# Patient Record
Sex: Male | Born: 1965 | Race: White | Hispanic: No | Marital: Married | Smoking: Never smoker
Health system: Southern US, Community
[De-identification: ages and names within clinical notes are randomized; demographics above are authoritative.]

## PROBLEM LIST (undated history)

## (undated) DIAGNOSIS — K649 Unspecified hemorrhoids: Secondary | ICD-10-CM

## (undated) DIAGNOSIS — M5412 Radiculopathy, cervical region: Secondary | ICD-10-CM

## (undated) DIAGNOSIS — R079 Chest pain, unspecified: Secondary | ICD-10-CM

## (undated) DIAGNOSIS — G4733 Obstructive sleep apnea (adult) (pediatric): Secondary | ICD-10-CM

## (undated) DIAGNOSIS — F32A Depression, unspecified: Secondary | ICD-10-CM

## (undated) DIAGNOSIS — Z9989 Dependence on other enabling machines and devices: Secondary | ICD-10-CM

## (undated) DIAGNOSIS — F418 Other specified anxiety disorders: Secondary | ICD-10-CM

## (undated) DIAGNOSIS — R072 Precordial pain: Secondary | ICD-10-CM

## (undated) DIAGNOSIS — F329 Major depressive disorder, single episode, unspecified: Secondary | ICD-10-CM

## (undated) HISTORY — PX: VASECTOMY: SHX75

## (undated) HISTORY — DX: Unspecified hemorrhoids: K64.9

## (undated) HISTORY — DX: Obstructive sleep apnea (adult) (pediatric): G47.33

## (undated) HISTORY — DX: Precordial pain: R07.2

## (undated) HISTORY — DX: Radiculopathy, cervical region: M54.12

## (undated) HISTORY — DX: Chest pain, unspecified: R07.9

## (undated) HISTORY — DX: Other specified anxiety disorders: F41.8

## (undated) HISTORY — DX: Dependence on other enabling machines and devices: Z99.89

---

## 2013-07-26 ENCOUNTER — Other Ambulatory Visit: Payer: Self-pay | Admitting: Emergency Medicine

## 2013-07-26 ENCOUNTER — Ambulatory Visit
Admission: RE | Admit: 2013-07-26 | Discharge: 2013-07-26 | Disposition: A | Discharge status: Home / Self Care | Source: Ambulatory Visit | Attending: Emergency Medicine | Admitting: Emergency Medicine

## 2013-07-26 DIAGNOSIS — R42 Dizziness and giddiness: Secondary | ICD-10-CM

## 2013-07-26 DIAGNOSIS — R519 Headache, unspecified: Secondary | ICD-10-CM

## 2015-11-05 ENCOUNTER — Emergency Department (HOSPITAL_COMMUNITY)

## 2015-11-05 ENCOUNTER — Inpatient Hospital Stay (HOSPITAL_COMMUNITY)
Admission: EM | Admit: 2015-11-05 | Discharge: 2015-11-06 | DRG: 313 | Disposition: A | Discharge status: Home / Self Care | Attending: Internal Medicine | Admitting: Internal Medicine

## 2015-11-05 ENCOUNTER — Inpatient Hospital Stay (HOSPITAL_COMMUNITY)

## 2015-11-05 ENCOUNTER — Other Ambulatory Visit (HOSPITAL_COMMUNITY)

## 2015-11-05 ENCOUNTER — Encounter (HOSPITAL_COMMUNITY): Payer: Self-pay | Admitting: Emergency Medicine

## 2015-11-05 DIAGNOSIS — R072 Precordial pain: Secondary | ICD-10-CM | POA: Diagnosis present

## 2015-11-05 DIAGNOSIS — R071 Chest pain on breathing: Secondary | ICD-10-CM | POA: Diagnosis not present

## 2015-11-05 DIAGNOSIS — R091 Pleurisy: Secondary | ICD-10-CM | POA: Diagnosis present

## 2015-11-05 DIAGNOSIS — R079 Chest pain, unspecified: Secondary | ICD-10-CM | POA: Diagnosis present

## 2015-11-05 DIAGNOSIS — R0981 Nasal congestion: Secondary | ICD-10-CM | POA: Diagnosis present

## 2015-11-05 DIAGNOSIS — Z79899 Other long term (current) drug therapy: Secondary | ICD-10-CM | POA: Diagnosis not present

## 2015-11-05 DIAGNOSIS — M50222 Other cervical disc displacement at C5-C6 level: Secondary | ICD-10-CM | POA: Diagnosis present

## 2015-11-05 DIAGNOSIS — K210 Gastro-esophageal reflux disease with esophagitis: Secondary | ICD-10-CM | POA: Diagnosis present

## 2015-11-05 DIAGNOSIS — F329 Major depressive disorder, single episode, unspecified: Secondary | ICD-10-CM | POA: Diagnosis present

## 2015-11-05 DIAGNOSIS — R0789 Other chest pain: Secondary | ICD-10-CM

## 2015-11-05 DIAGNOSIS — F32A Depression, unspecified: Secondary | ICD-10-CM

## 2015-11-05 DIAGNOSIS — M5412 Radiculopathy, cervical region: Secondary | ICD-10-CM | POA: Diagnosis present

## 2015-11-05 HISTORY — DX: Major depressive disorder, single episode, unspecified: F32.9

## 2015-11-05 HISTORY — DX: Chest pain, unspecified: R07.9

## 2015-11-05 HISTORY — DX: Depression, unspecified: F32.A

## 2015-11-05 HISTORY — DX: Precordial pain: R07.2

## 2015-11-05 LAB — CBC
HCT: 43.2 % (ref 39.0–52.0)
Hemoglobin: 14.5 g/dL (ref 13.0–17.0)
MCH: 29 pg (ref 26.0–34.0)
MCHC: 33.6 g/dL (ref 30.0–36.0)
MCV: 86.4 fL (ref 78.0–100.0)
PLATELETS: 216 10*3/uL (ref 150–400)
RBC: 5 MIL/uL (ref 4.22–5.81)
RDW: 13.7 % (ref 11.5–15.5)
WBC: 6.9 10*3/uL (ref 4.0–10.5)

## 2015-11-05 LAB — BASIC METABOLIC PANEL
Anion gap: 9 (ref 5–15)
BUN: 19 mg/dL (ref 6–20)
CALCIUM: 8.9 mg/dL (ref 8.9–10.3)
CHLORIDE: 106 mmol/L (ref 101–111)
CO2: 24 mmol/L (ref 22–32)
CREATININE: 1.02 mg/dL (ref 0.61–1.24)
GFR calc Af Amer: >60 mL/min (ref >60)
GFR calc non Af Amer: >60 mL/min (ref >60)
Glucose, Bld: 117 mg/dL — ABNORMAL HIGH (ref 65–99)
Potassium: 4.5 mmol/L (ref 3.5–5.1)
Sodium: 139 mmol/L (ref 135–145)

## 2015-11-05 LAB — TROPONIN I
Troponin I: <0.03 ng/mL (ref <0.031)
Troponin I: <0.03 ng/mL (ref <0.031)

## 2015-11-05 LAB — D-DIMER, QUANTITATIVE: D-Dimer, Quant: <0.27 ug/mL-FEU (ref 0.00–0.50)

## 2015-11-05 LAB — I-STAT TROPONIN, ED: TROPONIN I, POC: 0 ng/mL (ref 0.00–0.08)

## 2015-11-05 MED ORDER — SIMETHICONE 80 MG PO CHEW
80.0000 mg | CHEWABLE_TABLET | Freq: Four times a day (QID) | ORAL | Status: DC | PRN
  Filled 2015-11-05: qty 1

## 2015-11-05 MED ORDER — ACETAMINOPHEN 325 MG PO TABS
650.0000 mg | ORAL_TABLET | ORAL | Status: DC | PRN
  Administered 2015-11-05: 650 mg via ORAL
  Filled 2015-11-05: qty 2

## 2015-11-05 MED ORDER — ASPIRIN 81 MG PO CHEW
324.0000 mg | CHEWABLE_TABLET | Freq: Once | ORAL | Status: AC
  Administered 2015-11-05: 324 mg via ORAL
  Filled 2015-11-05: qty 4

## 2015-11-05 MED ORDER — NITROGLYCERIN 0.4 MG SL SUBL
0.4000 mg | SUBLINGUAL_TABLET | SUBLINGUAL | Status: DC | PRN
  Administered 2015-11-05: 0.4 mg via SUBLINGUAL
  Filled 2015-11-05: qty 1

## 2015-11-05 MED ORDER — GADOBENATE DIMEGLUMINE 529 MG/ML IV SOLN
20.0000 mL | Freq: Once | INTRAVENOUS | Status: AC | PRN
  Administered 2015-11-05: 20 mL via INTRAVENOUS

## 2015-11-05 MED ORDER — ENOXAPARIN SODIUM 40 MG/0.4ML ~~LOC~~ SOLN
40.0000 mg | SUBCUTANEOUS | Status: DC
  Administered 2015-11-05 – 2015-11-06 (×2): 40 mg via SUBCUTANEOUS
  Filled 2015-11-05 (×2): qty 0.4

## 2015-11-05 MED ORDER — PANTOPRAZOLE SODIUM 40 MG PO TBEC
40.0000 mg | DELAYED_RELEASE_TABLET | Freq: Every day | ORAL | Status: DC
  Administered 2015-11-05 – 2015-11-06 (×2): 40 mg via ORAL
  Filled 2015-11-05 (×2): qty 1

## 2015-11-05 MED ORDER — ONDANSETRON HCL 4 MG/2ML IJ SOLN: 4.0000 mg | Freq: Four times a day (QID) | INTRAMUSCULAR | Status: DC | PRN

## 2015-11-05 MED ORDER — CLONAZEPAM 0.5 MG PO TABS: 0.5000 mg | ORAL_TABLET | Freq: Two times a day (BID) | ORAL | Status: DC | PRN

## 2015-11-05 MED ORDER — FLUOXETINE HCL 20 MG PO CAPS
20.0000 mg | ORAL_CAPSULE | Freq: Every day | ORAL | Status: DC | PRN
  Filled 2015-11-05: qty 1

## 2015-11-05 NOTE — H&P (Addendum)
[facility] History and Physical  Nagee Goates ZOX:[id number] DOB: [birth year] DOA: 11/05/2015  Referring physician: ER  PCP: No primary care provider on file.   Chief Complaint: Chest pain   HPI:  50 year old male with a history of depression, no other significant past medical history, no family history of coronary artery disease who presents to the ER with substernal chest pain radiating into the right side of his neck, patient also had left-sided chest pain this morning with radiation of pain into the left arm associated with numbness and tingling of the left arm. Symptoms started at 6 AM today, while the patient was still in bed. Patient has had intermittent chest pains on and off for the last 9 years, not particularly related to exertion, 8-9 years ago the patient was admitted to [facility] for chest pain and had a negative stress test. Patient also complains of acute sinus congestion that started 2 days ago. No fever no cough      Review of Systems: negative for the following  Constitutional: Denies fever, chills, diaphoresis, appetite change and fatigue.  HEENT: Denies photophobia, eye pain, redness, hearing loss, ear pain, congestion, sore throat, rhinorrhea, sneezing, mouth sores, trouble swallowing, neck pain, neck stiffness and tinnitus.  Respiratory: Denies SOB, DOE, cough, chest tightness, and wheezing.  Cardiovascular: Positive for chest pain, palpitations and leg swelling.  Gastrointestinal: Denies nausea, vomiting, abdominal pain, diarrhea, constipation, blood in stool and abdominal distention.  Genitourinary: Denies dysuria, urgency, frequency, hematuria, flank pain and difficulty urinating.  Musculoskeletal: Denies myalgias, back pain, joint swelling, arthralgias and gait problem.  Skin: Denies pallor, rash and wound.  Neurological: Denies dizziness, seizures, syncope, weakness, light-headedness, numbness and headaches.  Hematological: Denies adenopathy. Easy  bruising, personal or family bleeding history  Psychiatric/Behavioral: Denies suicidal ideation, mood changes, confusion, nervousness, sleep disturbance and agitation       Past Medical History  Diagnosis Date  . Depression      Past Surgical History  Procedure Laterality Date  . Vasectomy        Social History:  has no tobacco, alcohol, and drug history on file.  No Known Allergies      FAMILY HISTORY  When questioned  Directly-patient reports  No family history of HTN, CVA ,DIABETES, TB, Cancer CAD, Bleeding Disorders, Sickle Cell, diabetes, anemia, asthma,   Prior to Admission medications   Medication Sig Start Date End Date Taking? Authorizing Provider  clonazePAM (KLONOPIN) 0.5 MG tablet Take 0.25 mg by mouth 2 (two) times daily as needed for anxiety.  10/01/15  Yes Historical Provider, MD  FLUoxetine (PROZAC) 20 MG capsule Take 20 mg by mouth daily as needed (anxiety).  10/01/15  Yes Historical Provider, MD     Physical Exam: Filed Vitals:   11/05/15 0721 11/05/15 0900 11/05/15 1024  BP: 126/91 102/70 124/73  Pulse: 90 74 72  Temp: 98.2 F (36.8 C)  98.5 F (36.9 C)  TempSrc: Oral  Oral  Resp:  16 18  Height:   5\' 10"  (1.778 m)  Weight:   101.424 kg (223 lb 9.6 oz)  SpO2: 99% 97% 99%     Constitutional: Vital signs reviewed. Patient is a well-developed and well-nourished in no acute distress and cooperative with exam. Alert and oriented x3.  Head: Normocephalic and atraumatic  Ear: TM normal bilaterally  Mouth: no erythema or exudates, MMM  Eyes: PERRL, EOMI, conjunctivae normal, No scleral icterus.  Neck: Supple, Trachea midline normal ROM, No JVD, mass, thyromegaly, or carotid bruit present.  Cardiovascular: RRR, S1 normal, S2 normal, no MRG, pulses symmetric and intact bilaterally  Pulmonary/Chest: CTAB, no wheezes, rales, or rhonchi  Abdominal: Soft. Non-tender, non-distended, bowel sounds are normal, no masses, organomegaly, or guarding present.  GU:  no CVA tenderness Musculoskeletal: No joint deformities, erythema, or stiffness, ROM full and no nontender Ext: no edema and no cyanosis, pulses palpable bilaterally (DP and PT)  Hematology: no cervical, inginal, or axillary adenopathy.  Neurological: A&O x3, Strenght is normal and symmetric bilaterally, cranial nerve II-XII are grossly intact, no focal motor deficit, sensory intact to light touch bilaterally.  Skin: Warm, dry and intact. No rash, cyanosis, or clubbing.  Psychiatric: Normal mood and affect. speech and behavior is normal. Judgment and thought content normal. Cognition and memory are normal.      Data Review   Micro Results No results found for this or any previous visit (from the past 240 hour(s)).  Radiology Reports Dg Chest 2 View  11/05/2015  CLINICAL DATA:  Chest pain EXAM: CHEST  2 VIEW COMPARISON:  11/05/2006 FINDINGS: The heart size and mediastinal contours are within normal limits. Both lungs are clear. The visualized skeletal structures are unremarkable. IMPRESSION: No active cardiopulmonary disease. Electronically Signed   By: Marlan Palau M.D.   On: 11/05/2015 07:55     CBC  Recent Labs Lab 11/05/15 0734  WBC 6.9  HGB 14.5  HCT 43.2  PLT 216  MCV 86.4  MCH 29.0  MCHC 33.6  RDW 13.7    Chemistries   Recent Labs Lab 11/05/15 0734  NA 139  K 4.5  CL 106  CO2 24  GLUCOSE 117*  BUN 19  CREATININE 1.02  CALCIUM 8.9   ------------------------------------------------------------------------------------------------------------------ estimated creatinine clearance is 102.3 mL/min (by C-G formula based on Cr of 1.02). ------------------------------------------------------------------------------------------------------------------ No results for input(s): HGBA1C in the last 72 hours. ------------------------------------------------------------------------------------------------------------------ No results for input(s): CHOL, HDL, LDLCALC,  TRIG, CHOLHDL, LDLDIRECT in the last 72 hours. ------------------------------------------------------------------------------------------------------------------ No results for input(s): TSH, T4TOTAL, T3FREE, THYROIDAB in the last 72 hours.  Invalid input(s): FREET3 ------------------------------------------------------------------------------------------------------------------ No results for input(s): VITAMINB12, FOLATE, FERRITIN, TIBC, IRON, RETICCTPCT in the last 72 hours.  Coagulation profile No results for input(s): INR, PROTIME in the last 168 hours.   Recent Labs  11/05/15 0733  DDIMER <0.27    Cardiac Enzymes  Recent Labs Lab 11/05/15 0733  TROPONINI <0.03   ------------------------------------------------------------------------------------------------------------------ Invalid input(s): POCBNP   CBG: No results for input(s): GLUCAP in the last 168 hours.     EKG: Independently reviewed. Date/Time: Monday November 05 2015 07:17:59 EST Ventricular Rate: 87 PR Interval: 140 QRS Duration: 103 QT Interval: 362 QTC Calculation: 435 R Axis: 69 Text Interpretation: Sinus rhythm Abnormal R-wave progression, early  transition Minimal ST depression, anterolateral leads    Assessment/Plan Principal Problem:   Precordial chest pain Heart score of 3 or less, patient admitted to telemetry under chest pain order set Initial workup including troponin, d-dimer, EKG reassuring Chest x-ray negative Symptoms atypical, nonexertional, doubt unstable angina, differential diagnosis includes viral pleurisy, reflux esophagitis, cervical radiculopathy Will start patient on a PPI MRI of the C-spine today, patient is agreeable 2-D echo, If workup negative anticipate discharge tomorrow    Depression-continue outpatient medications        Code Status Orders    Full code    Start     Ordered     Family Communication: bedside Disposition Plan: admit   Total  time spent 55 minutes.Greater than 50% of this time was  spent in counseling, explanation of diagnosis, planning of further management, and coordination of care  [facility] Pager [phone]  If 7PM-7AM, please contact night-coverage [email or web address] Password [email or web address] 11/05/2015, 11:05 AM    The

## 2015-11-05 NOTE — ED Notes (Signed)
Pt states woke up with left chest, bilateral shoulders, upper back, and left arm pain. Denies SOB, N/V, weakness, dizziness. Hx of right sided problems similar to this with negative stress test performed in past

## 2015-11-05 NOTE — ED Provider Notes (Signed)
CSN: [id number]     Arrival date & time 11/05/15  0705 History   First MD Initiated Contact with Patient 11/05/15 0725     Chief Complaint  Patient presents with  . Chest Pain  . Arm Pain     (Consider location/radiation/quality/duration/timing/severity/associated sxs/prior Treatment) HPI Comments: Patient here complaining of sudden onset of substernal chest pain that began while he was walking at home. Pain started in his chest and went up into his neck and down his left arm with some associated left arm paresthesias history of similar symptoms but usually with radiation to the right side. According to him, he had a negative stress test 8 or 9 years ago. He denies any associated dyspnea, diaphoresis. Pain is worse with exertion better with rest. No treatment use prior to arrival. Denies any recent history of leg pain or swelling. No URI symptoms. Was in his baseline state of health yesterday  Patient is a 50 y.o. male presenting with chest pain and arm pain. The history is provided by the patient and the spouse.  Chest Pain Arm Pain Associated symptoms include chest pain.    Past Medical History  Diagnosis Date  . Depression    Past Surgical History  Procedure Laterality Date  . Vasectomy     History reviewed. No pertinent family history. Social History  Substance Use Topics  . Smoking status: None  . Smokeless tobacco: None  . Alcohol Use: None    Review of Systems  Cardiovascular: Positive for chest pain.  All other systems reviewed and are negative.     Allergies  Review of patient's allergies indicates no known allergies.  Home Medications   Prior to Admission medications   Not on File   BP 126/91 mmHg  Pulse 90  Temp(Src) 98.2 F (36.8 C) (Oral)  SpO2 99% Physical Exam  Constitutional: He is oriented to person, place, and time. He appears well-developed and well-nourished.  Non-toxic appearance. No distress.  HENT:  Head: Normocephalic and atraumatic.   Eyes: Conjunctivae, EOM and lids are normal. Pupils are equal, round, and reactive to light.  Neck: Normal range of motion. Neck supple. No tracheal deviation present. No thyroid mass present.  Cardiovascular: Normal rate, regular rhythm and normal heart sounds.  Exam reveals no gallop.   No murmur heard. Pulmonary/Chest: Effort normal and breath sounds normal. No stridor. No respiratory distress. He has no decreased breath sounds. He has no wheezes. He has no rhonchi. He has no rales.  Abdominal: Soft. Normal appearance and bowel sounds are normal. He exhibits no distension. There is no tenderness. There is no rebound and no CVA tenderness.  Musculoskeletal: Normal range of motion. He exhibits no edema or tenderness.  Neurological: He is alert and oriented to person, place, and time. He has normal strength. No cranial nerve deficit or sensory deficit. GCS eye subscore is 4. GCS verbal subscore is 5. GCS motor subscore is 6.  Skin: Skin is warm and dry. No abrasion and no rash noted.  Psychiatric: He has a normal mood and affect. His speech is normal and behavior is normal.  Nursing note and vitals reviewed.   ED Course  Procedures (including critical care time) Labs Review Labs Reviewed  BASIC METABOLIC PANEL  CBC  I-STAT TROPOININ, ED    Imaging Review No results found. I have personally reviewed and evaluated these images and lab results as part of my medical decision-making.   EKG Interpretation   Date/Time:  Monday November 05 2015 07:17:59  EST Ventricular Rate:  87 PR Interval:  140 QRS Duration: 103 QT Interval:  362 QTC Calculation: 435 R Axis:   69 Text Interpretation:  Sinus rhythm Abnormal R-wave progression, early  transition Minimal ST depression, anterolateral leads Confirmed by Deverick Pruss   MD, Bevely Hackbart ([id number]) on 11/05/2015 7:26:29 AM      MDM   Final diagnoses:  None    Patient given aspirin and also nitroglycerin and pain is greatly improved. Will be  admitted for chest pain workup. No acute EKG changes and troponin is negative.    Lorre Nick, MD 11/05/15 [phone]

## 2015-11-05 NOTE — Progress Notes (Signed)
MD notified of return of patient's chest pain. Patient stated, "The pain is back like when I came to the hospital. I don't know if it's because I ate or not." Pain located in mid/left chest- rated a 5. VSS. New orders received- obtain EKG and administer PO Protonix. Patient stated pain was "getting better" while obtaining EKG.

## 2015-11-05 NOTE — ED Notes (Signed)
MD at bedside. 

## 2015-11-06 ENCOUNTER — Inpatient Hospital Stay (HOSPITAL_COMMUNITY)

## 2015-11-06 DIAGNOSIS — R071 Chest pain on breathing: Secondary | ICD-10-CM

## 2015-11-06 DIAGNOSIS — R079 Chest pain, unspecified: Secondary | ICD-10-CM

## 2015-11-06 DIAGNOSIS — M5412 Radiculopathy, cervical region: Secondary | ICD-10-CM

## 2015-11-06 LAB — CBC
HEMATOCRIT: 42.1 % (ref 39.0–52.0)
HEMOGLOBIN: 14 g/dL (ref 13.0–17.0)
MCH: 29.2 pg (ref 26.0–34.0)
MCHC: 33.3 g/dL (ref 30.0–36.0)
MCV: 87.9 fL (ref 78.0–100.0)
Platelets: 176 10*3/uL (ref 150–400)
RBC: 4.79 MIL/uL (ref 4.22–5.81)
RDW: 13.9 % (ref 11.5–15.5)
WBC: 5.7 10*3/uL (ref 4.0–10.5)

## 2015-11-06 LAB — COMPREHENSIVE METABOLIC PANEL
ALBUMIN: 4 g/dL (ref 3.5–5.0)
ALK PHOS: 63 U/L (ref 38–126)
ALT: 27 U/L (ref 17–63)
AST: 17 U/L (ref 15–41)
Anion gap: 6 (ref 5–15)
BILIRUBIN TOTAL: 0.6 mg/dL (ref 0.3–1.2)
BUN: 19 mg/dL (ref 6–20)
CALCIUM: 8.9 mg/dL (ref 8.9–10.3)
CO2: 28 mmol/L (ref 22–32)
CREATININE: 0.94 mg/dL (ref 0.61–1.24)
Chloride: 107 mmol/L (ref 101–111)
GFR calc Af Amer: >60 mL/min (ref >60)
GFR calc non Af Amer: >60 mL/min (ref >60)
GLUCOSE: 114 mg/dL — AB (ref 65–99)
Potassium: 4.2 mmol/L (ref 3.5–5.1)
Sodium: 141 mmol/L (ref 135–145)
TOTAL PROTEIN: 6.8 g/dL (ref 6.5–8.1)

## 2015-11-06 MED ORDER — PANTOPRAZOLE SODIUM 40 MG PO TBEC: 40.0000 mg | DELAYED_RELEASE_TABLET | Freq: Every day | ORAL | Status: DC

## 2015-11-06 NOTE — Care Management Note (Signed)
Case Management Note  Patient Details  Name: Gabriel Vasquez MRN: [id number] Date of Birth: 1966-05-09  Subjective/Objective:  50 y/o m admitted w/Chest Pain. From home. Received referral for pcp. Patient has [employer] benefit. Patient has chosen to go to [facility]-CM called [facility] office-they have an assigned pcp & patient will have to call their office to establish the pcp appt. Patient provided w/brochure(tel#, address). Patient voiced understanding.Patient has a pharmacy.Informed patient of observation status.Patient voiced understanding.                  Action/Plan:d/c home.   Expected Discharge Date:   (UNKNOWN)               Expected Discharge Plan:  Home/Self Care  In-House Referral:     Discharge planning Services  CM Consult, Follow-up appt scheduled  Post Acute Care Choice:    Choice offered to:     DME Arranged:    DME Agency:     HH Arranged:    HH Agency:     Status of Service:  Completed, signed off  Medicare Important Message Given:    Date Medicare IM Given:    Medicare IM give by:    Date Additional Medicare IM Given:    Additional Medicare Important Message give by:     If discussed at Long Length of Stay Meetings, dates discussed:    Additional Comments:  Lanier Clam, RN 11/06/2015, 12:24 PM

## 2015-11-06 NOTE — Progress Notes (Signed)
Echocardiogram 2D Echocardiogram has been performed.  Gabriel Vasquez 11/06/2015, 10:59 AM

## 2015-11-06 NOTE — Discharge Summary (Addendum)
Physician Discharge Summary  Gabriel Vasquez MRN: [id number] DOB/AGE: 1966/09/06 50 y.o.  PCP: No primary care provider on file.   Admit date: 11/05/2015 Discharge date: 11/06/2015  Discharge Diagnoses:   Principal Problem:   Precordial chest pain Active Problems:   Depression   Chest pain    Follow-up recommendations Follow-up with PCP in 3-5 days , including all  additional recommended appointments as below Follow-up CBC, CMP in 3-5 days PCP to review results of 2-D echo     Medication List    TAKE these medications        clonazePAM 0.5 MG tablet  Commonly known as:  KLONOPIN  Take 0.25 mg by mouth 2 (two) times daily as needed for anxiety.     FLUoxetine 20 MG capsule  Commonly known as:  PROZAC  Take 20 mg by mouth daily as needed (anxiety).     pantoprazole 40 MG tablet  Commonly known as:  PROTONIX  Take 1 tablet (40 mg total) by mouth daily.         Discharge Condition: Stable  Discharge Instructions       Discharge Instructions    Diet - low sodium heart healthy    Complete by:  As directed      Increase activity slowly    Complete by:  As directed            No Known Allergies    Disposition: Final discharge disposition not confirmed   Consults: * None  Significant Diagnostic Studies:  Dg Chest 2 View  11/05/2015  CLINICAL DATA:  Chest pain EXAM: CHEST  2 VIEW COMPARISON:  11/05/2006 FINDINGS: The heart size and mediastinal contours are within normal limits. Both lungs are clear. The visualized skeletal structures are unremarkable. IMPRESSION: No active cardiopulmonary disease. Electronically Signed   By: Franchot Gallo M.D.   On: 11/05/2015 07:55   Mr Cervical Spine W Wo Contrast  11/05/2015  CLINICAL DATA:  50 year old male with C5 radiculopathy. Neck pain and left arm pain. No recall of injury. No history of cancer. Initial encounter. EXAM: MRI CERVICAL SPINE WITHOUT AND WITH CONTRAST TECHNIQUE: Multiplanar and multiecho pulse  sequences of the cervical spine, to include the craniocervical junction and cervicothoracic junction, were obtained according to standard protocol without and with intravenous contrast. CONTRAST:  58m MULTIHANCE GADOBENATE DIMEGLUMINE 529 MG/ML IV SOLN COMPARISON:  None. FINDINGS: Patient moved during exam. Cervical medullary junction unremarkable. Maxillary sinus mucosal thickening. No focal cord signal abnormality or enhancement. C2-3: Minimal right uncinate hypertrophy with minimal right foraminal narrowing. C3-4:  Minimal bulge.  Minimal narrowing ventral thecal sac. C4-5: Mild bulge. Mild narrowing ventral thecal sac. Minimal foraminal narrowing greater on the right. C5-6: Shallow left paracentral protrusion. Narrowing left ventral thecal sac. Minimal foraminal narrowing greater on the left. C6-7:  Negative. C7-T1:  Negative. IMPRESSION: Patient moved during exam. C5-6 shallow left paracentral protrusion. Narrowing left ventral thecal sac. Minimal foraminal narrowing greater on the left. C4-5 mild bulge. Mild narrowing ventral thecal sac. Minimal foraminal narrowing greater on the right. C3-4 minimal bulge with minimal narrowing ventral thecal sac. C2-3 minimal right foraminal narrowing. Electronically Signed   By: SGenia DelM.D.   On: 11/05/2015 14:31      2-D echo pending   Filed Weights   11/05/15 1024  Weight: 101.424 kg (223 lb 9.6 oz)     Microbiology: No results found for this or any previous visit (from the past 240 hour(s)).     Blood Culture No  results found for: SDES, [place], [place], REPTSTATUS    Labs: Results for orders placed or performed during the hospital encounter of 11/05/15 (from the past 48 hour(s))  D-dimer, quantitative (not at [facility])     Status: None   Collection Time: 11/05/15  7:33 AM  Result Value Ref Range   D-Dimer, Quant <0.27 0.00 - 0.50 ug/mL-FEU    Comment: (NOTE) At the manufacturer cut-off of 0.50 ug/mL FEU, this assay has been documented  to exclude PE with a sensitivity and negative predictive value of 97 to 99%.  At this time, this assay has not been approved by the FDA to exclude DVT/VTE. Results should be correlated with clinical presentation.   Troponin I-serum (0, 3, 6 hours)     Status: None   Collection Time: 11/05/15  7:33 AM  Result Value Ref Range   Troponin I <0.03 <0.031 ng/mL    Comment:        NO INDICATION OF MYOCARDIAL INJURY.   Basic metabolic panel     Status: Abnormal   Collection Time: 11/05/15  7:34 AM  Result Value Ref Range   Sodium 139 135 - 145 mmol/L   Potassium 4.5 3.5 - 5.1 mmol/L   Chloride 106 101 - 111 mmol/L   CO2 24 22 - 32 mmol/L   Glucose, Bld 117 (H) 65 - 99 mg/dL   BUN 19 6 - 20 mg/dL   Creatinine, Ser 1.02 0.61 - 1.24 mg/dL   Calcium 8.9 8.9 - 10.3 mg/dL   GFR calc non Af Amer >60 >60 mL/min   GFR calc Af Amer >60 >60 mL/min    Comment: (NOTE) The eGFR has been calculated using the CKD EPI equation. This calculation has not been validated in all clinical situations. eGFR's persistently <60 mL/min signify possible Chronic Kidney Disease.    Anion gap 9 5 - 15  CBC     Status: None   Collection Time: 11/05/15  7:34 AM  Result Value Ref Range   WBC 6.9 4.0 - 10.5 K/uL   RBC 5.00 4.22 - 5.81 MIL/uL   Hemoglobin 14.5 13.0 - 17.0 g/dL   HCT 43.2 39.0 - 52.0 %   MCV 86.4 78.0 - 100.0 fL   MCH 29.0 26.0 - 34.0 pg   MCHC 33.6 30.0 - 36.0 g/dL   RDW 13.7 11.5 - 15.5 %   Platelets 216 150 - 400 K/uL  I-stat troponin, ED (not at [facility])     Status: None   Collection Time: 11/05/15  7:49 AM  Result Value Ref Range   Troponin i, poc 0.00 0.00 - 0.08 ng/mL   Comment 3            Comment: Due to the release kinetics of cTnI, a negative result within the first hours of the onset of symptoms does not rule out myocardial infarction with certainty. If myocardial infarction is still suspected, repeat the test at appropriate intervals.   Troponin I-serum (0, 3, 6 hours)      Status: None   Collection Time: 11/05/15 11:55 AM  Result Value Ref Range   Troponin I <0.03 <0.031 ng/mL    Comment:        NO INDICATION OF MYOCARDIAL INJURY.   Troponin I-serum (0, 3, 6 hours)     Status: None   Collection Time: 11/05/15  5:52 PM  Result Value Ref Range   Troponin I <0.03 <0.031 ng/mL    Comment:  NO INDICATION OF MYOCARDIAL INJURY.   CBC     Status: None   Collection Time: 11/06/15  5:55 AM  Result Value Ref Range   WBC 5.7 4.0 - 10.5 K/uL   RBC 4.79 4.22 - 5.81 MIL/uL   Hemoglobin 14.0 13.0 - 17.0 g/dL   HCT 42.1 39.0 - 52.0 %   MCV 87.9 78.0 - 100.0 fL   MCH 29.2 26.0 - 34.0 pg   MCHC 33.3 30.0 - 36.0 g/dL   RDW 13.9 11.5 - 15.5 %   Platelets 176 150 - 400 K/uL  Comprehensive metabolic panel     Status: Abnormal   Collection Time: 11/06/15  5:55 AM  Result Value Ref Range   Sodium 141 135 - 145 mmol/L   Potassium 4.2 3.5 - 5.1 mmol/L   Chloride 107 101 - 111 mmol/L   CO2 28 22 - 32 mmol/L   Glucose, Bld 114 (H) 65 - 99 mg/dL   BUN 19 6 - 20 mg/dL   Creatinine, Ser 0.94 0.61 - 1.24 mg/dL   Calcium 8.9 8.9 - 10.3 mg/dL   Total Protein 6.8 6.5 - 8.1 g/dL   Albumin 4.0 3.5 - 5.0 g/dL   AST 17 15 - 41 U/L   ALT 27 17 - 63 U/L   Alkaline Phosphatase 63 38 - 126 U/L   Total Bilirubin 0.6 0.3 - 1.2 mg/dL   GFR calc non Af Amer >60 >60 mL/min   GFR calc Af Amer >60 >60 mL/min    Comment: (NOTE) The eGFR has been calculated using the CKD EPI equation. This calculation has not been validated in all clinical situations. eGFR's persistently <60 mL/min signify possible Chronic Kidney Disease.    Anion gap 6 5 - 15     Lipid Panel  No results found for: CHOL, TRIG, HDL, CHOLHDL, VLDL, LDLCALC, LDLDIRECT   No results found for: HGBA1C   Lab Results  Component Value Date   CREATININE 0.94 11/06/2015     HPI :HPI:  50 year old male with a history of depression, no other significant past medical history, no family history of coronary  artery disease who presents to the ER with substernal chest pain radiating into the right side of his neck, patient also had left-sided chest pain this morning with radiation of pain into the left arm associated with numbness and tingling of the left arm. Symptoms started at 6 AM today, while the patient was still in bed. Patient has had intermittent chest pains on and off for the last 9 years, not particularly related to exertion, 8-9 years ago the patient was admitted to [facility] for chest pain and had a negative stress test. Patient also complains of acute sinus congestion that started 2 days ago. No fever no cough  HOSPITAL COURSE:   Precordial chest pain Heart score of 3 or less, patient admitted to telemetry which showed normal sinus rhythm Initial workup including troponin 3 negative, d-dimer negative, EKG reassuring Chest x-ray negative Symptoms atypical, nonexertional, doubt unstable angina, differential diagnosis includes viral pleurisy, reflux esophagitis, cervical radiculopathy Will start patient on a PPI which will be continued MRI of the C-spine as above, C5-C6 shallow left paracentral protrusion with narrowing of the left ventral thecal sac could explain patient's left upper extremity numbness and tingling 2-D echo pending, patient may be discharged home after completion of the test request PCP to follow-up on the results of the 2-D echo Patient is currently hemodynamically stable   Depression-continue outpatient medications  Discharge Exam:   Blood pressure 118/78, pulse 67, temperature 98.1 F (36.7 C), temperature source Oral, resp. rate 18, height 5' 10"  (1.778 m), weight 101.424 kg (223 lb 9.6 oz), SpO2 100 %. Cardiovascular: RRR, S1 normal, S2 normal, no MRG, pulses symmetric and intact bilaterally  Pulmonary/Chest: CTAB, no wheezes, rales, or rhonchi  Abdominal: Soft. Non-tender, non-distended, bowel sounds are normal, no masses, organomegaly, or guarding  present.  GU: no CVA tenderness Musculoskeletal: No joint deformities, erythema, or stiffness, ROM full and no nontender Ext: no edema and no cyanosis, pulses palpable bilaterally (DP and PT)  Hematology: no cervical, inginal, or axillary adenopathy.  Neurological: A&O x3, Strenght is normal and symmetric bilaterally, cranial nerve II-XII are grossly intact, no focal motor deficit, sensory intact to light touch bilaterally.      Follow-up Information    Follow up with PCP. Schedule an appointment as soon as possible for a visit in 3 days.      SignedReyne Dumas 11/06/2015, 10:24 AM        Time spent >45 mins

## 2015-12-05 ENCOUNTER — Ambulatory Visit (INDEPENDENT_AMBULATORY_CARE_PROVIDER_SITE_OTHER): Admitting: Cardiology

## 2015-12-05 ENCOUNTER — Telehealth (HOSPITAL_COMMUNITY): Payer: Self-pay | Admitting: *Deleted

## 2015-12-05 ENCOUNTER — Encounter: Payer: Self-pay | Admitting: Cardiology

## 2015-12-05 VITALS — BP 124/74 | HR 78 | Ht 70.0 in | Wt 221.6 lb

## 2015-12-05 DIAGNOSIS — M5412 Radiculopathy, cervical region: Secondary | ICD-10-CM

## 2015-12-05 DIAGNOSIS — R0789 Other chest pain: Secondary | ICD-10-CM | POA: Diagnosis not present

## 2015-12-05 LAB — LIPID PANEL
Cholesterol: 194 mg/dL (ref 125–200)
HDL: 41 mg/dL (ref >=40)
LDL Cholesterol: 135 mg/dL — ABNORMAL HIGH (ref <130)
Total CHOL/HDL Ratio: 4.7 Ratio (ref <=5.0)
Triglycerides: 91 mg/dL (ref <150)
VLDL: 18 mg/dL (ref <30)

## 2015-12-05 NOTE — Telephone Encounter (Signed)
Patient given detailed instructions per Myocardial Perfusion Study Information Sheet for the test on 12/10/15. Patient notified to arrive 15 minutes early and that it is imperative to arrive on time for appointment to keep from having the test rescheduled.  If you need to cancel or reschedule your appointment, please call the office within 24 hours of your appointment. Failure to do so may result in a cancellation of your appointment, and a $50 no show fee. Patient verbalized understanding.Kyran Whittier J Aline Wesche, RN  

## 2015-12-05 NOTE — Patient Instructions (Signed)
Medication Instructions:   Your physician recommends that you continue on your current medications as directed. Please refer to the Current Medication list given to you today.  Labwork: TODAY   LIPID  Testing/Procedure Your physician has requested that you have en exercise stress myoview. For further information please visit [email or web address]. Please follow instruction sheet, as given.   Follow-Up: Your physician recommends that you schedule a follow-up appointment in:  AS NEEDED  Any Other Special Instructions Will Be Listed Below (If Applicable).     If you need a refill on your cardiac medications before your next appointment, please call your pharmacy.

## 2015-12-05 NOTE — Progress Notes (Signed)
Cardiology Office Note    Date:  12/05/2015   ID:  Quadarius Henton, DOB 12-27-1965, MRN [id number]  PCP:  No primary care provider on file.  Cardiologist:   Donato Schultz, MD     History of Present Illness:  Gabriel Vasquez is a 50 y.o. male with history of depression no early family history of CAD who was in the hospital on 11/06/15 with chest pain substernal radiating to his right side, also left side radiating at times the left arm with some numbness and tingling as well. He had intermittent chest pain over the last 9 years. He had a negative stress test about 10 years ago.  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 PPI helps with GERD. Cervical spine disease noted  He works in [place], creates highway signs, maintains about 13,000 steps a day. Quite active.  Past Medical History  Diagnosis Date  . Depression   . Precordial chest pain 11/05/2015  . Chest pain 11/05/2015  . Cervical radiculopathy at C5     Past Surgical History  Procedure Laterality Date  . Vasectomy      Outpatient Prescriptions Prior to Visit  Medication Sig Dispense Refill  . clonazePAM (KLONOPIN) 0.5 MG tablet Take 0.25 mg by mouth 2 (two) times daily as needed for anxiety.   0  . FLUoxetine (PROZAC) 20 MG capsule Take 20 mg by mouth daily as needed (anxiety).   1  . pantoprazole (PROTONIX) 40 MG tablet Take 1 tablet (40 mg total) by mouth daily. 30 tablet 2   No facility-administered medications prior to visit.     Allergies:   Review of patient's allergies indicates no known allergies.   Social History   Social History  . Marital Status: Married    Spouse Name: N/A  . Number of Children: N/A  . Years of Education: N/A   Social History Main Topics  . Smoking status: Never Smoker   . Smokeless tobacco: Never Used  . Alcohol Use: None  . Drug Use: No  . Sexual Activity: Yes   Other Topics Concern  . None   Social History Narrative     Family History:  The patient's family history includes Hypertension in  his mother.  Gfather 77 MI ROS:   Please see the history of present illness.    ROS All other systems reviewed and are negative.   PHYSICAL EXAM:   VS:  BP 124/74 mmHg  Pulse 78  Ht  (1.778 m)  Wt 221 lb 9.6 oz (100.517 kg)  BMI 31.80 kg/m2   GEN: Well nourished, well developed, in no acute distress HEENT: normal Neck: no JVD, carotid bruits, or masses Cardiac: RRR; no murmurs, rubs, or gallops,no edema  Respiratory:  clear to auscultation bilaterally, normal work of breathing GI: soft, nontender, nondistended, + BS MS: no deformity or atrophy Skin: warm and dry, no rash Neuro:  Alert and Oriented x 3, Strength and sensation are intact Psych: euthymic mood, full affect  Wt Readings from Last 3 Encounters:  12/05/15 221 lb 9.6 oz (100.517 kg)  11/05/15 223 lb 9.6 oz (101.424 kg)      Studies/Labs Reviewed:   EKG:  11/05/15 shows sinus rhythm with nonspecific ST-T wave changes.  Recent Labs: 11/06/2015: ALT 27; BUN 19; Creatinine, Ser 0.94; Hemoglobin 14.0; Platelets 176; Potassium 4.2; Sodium 141   Lipid Panel No results found for: CHOL, TRIG, HDL, CHOLHDL, VLDL, LDLCALC, LDLDIRECT  Additional studies/ records that were reviewed today include:  Hospital records reviewed,  echocardiogram reviewed    ASSESSMENT:    1. Other chest pain   2. Cervical radiculopathy at C5      PLAN:  In order of problems listed above:  Chest pain-we will go ahead and order a nuclear stress test to ensure it indeed doesn't have any evidence of ischemia. His echocardiogram does show hypokinesis of the inferior wall. Ejection fraction low normal. 50-55%. He had nonspecific ST-T wave changes on EKG.  We will check lipid panel. He does not know his cholesterol currently.  Cervical disc disease-certainly some of his symptoms, numbness at times could be radiculopathy. We discussed.  He was anxious about what he read on the Internet about his echocardiogram. Diastolic  dysfunction.    Medication Adjustments/Labs and Tests Ordered: Current medicines are reviewed at length with the patient today.  Concerns regarding medicines are outlined above.  Medication changes, Labs and Tests ordered today are listed in the Patient Instructions below. Patient Instructions  Medication Instructions:   Your physician recommends that you continue on your current medications as directed. Please refer to the Current Medication list given to you today.  Labwork: TODAY   LIPID  Testing/Procedure Your physician has requested that you have en exercise stress myoview. For further information please visit [email or web address]. Please follow instruction sheet, as given.   Follow-Up: Your physician recommends that you schedule a follow-up appointment in:  AS NEEDED  Any Other Special Instructions Will Be Listed Below (If Applicable).     If you need a refill on your cardiac medications before your next appointment, please call your pharmacy.         Mathews RobinsonsSigned, Anas Reister, Thad, MD  12/05/2015 11:05 AM    [facility] [address], [place], [place]  [address] Phone: [phone]; Fax: [phone]

## 2015-12-10 ENCOUNTER — Ambulatory Visit (HOSPITAL_COMMUNITY): Attending: Cardiovascular Disease

## 2015-12-10 DIAGNOSIS — R42 Dizziness and giddiness: Secondary | ICD-10-CM | POA: Insufficient documentation

## 2015-12-10 DIAGNOSIS — R002 Palpitations: Secondary | ICD-10-CM | POA: Insufficient documentation

## 2015-12-10 DIAGNOSIS — R0609 Other forms of dyspnea: Secondary | ICD-10-CM | POA: Diagnosis not present

## 2015-12-10 DIAGNOSIS — R0789 Other chest pain: Secondary | ICD-10-CM

## 2015-12-10 DIAGNOSIS — R9439 Abnormal result of other cardiovascular function study: Secondary | ICD-10-CM | POA: Insufficient documentation

## 2015-12-10 LAB — MYOCARDIAL PERFUSION IMAGING
CHL CUP NUCLEAR SDS: 0
CHL CUP NUCLEAR SSS: 0
CSEPHR: 91 %
CSEPPHR: 155 {beats}/min
Estimated workload: 11.6 METS
Exercise duration (min): 10 min
Exercise duration (sec): 0 s
LHR: 0.21
LV dias vol: 133 mL (ref 62–150)
LVSYSVOL: 73 mL
MPHR: 169 {beats}/min
Rest HR: 64 {beats}/min
SRS: 0
TID: 1.13

## 2015-12-10 MED ORDER — TECHNETIUM TC 99M SESTAMIBI GENERIC - CARDIOLITE
10.6000 | Freq: Once | INTRAVENOUS | Status: AC | PRN
  Administered 2015-12-10: 11 via INTRAVENOUS

## 2015-12-10 MED ORDER — TECHNETIUM TC 99M SESTAMIBI GENERIC - CARDIOLITE
31.5000 | Freq: Once | INTRAVENOUS | Status: AC | PRN
  Administered 2015-12-10: 32 via INTRAVENOUS

## 2015-12-20 ENCOUNTER — Ambulatory Visit (INDEPENDENT_AMBULATORY_CARE_PROVIDER_SITE_OTHER): Admitting: Cardiology

## 2015-12-20 ENCOUNTER — Encounter: Payer: Self-pay | Admitting: Cardiology

## 2015-12-20 ENCOUNTER — Encounter: Payer: Self-pay | Admitting: *Deleted

## 2015-12-20 VITALS — BP 114/76 | HR 66 | Ht 70.0 in | Wt 224.2 lb

## 2015-12-20 DIAGNOSIS — R0789 Other chest pain: Secondary | ICD-10-CM

## 2015-12-20 DIAGNOSIS — M5412 Radiculopathy, cervical region: Secondary | ICD-10-CM

## 2015-12-20 DIAGNOSIS — Z01818 Encounter for other preprocedural examination: Secondary | ICD-10-CM | POA: Diagnosis not present

## 2015-12-20 LAB — CBC WITH DIFFERENTIAL/PLATELET
BASOS ABS: 0 10*3/uL (ref 0.0–0.1)
BASOS PCT: 0 % (ref 0–1)
Eosinophils Absolute: 0.1 10*3/uL (ref 0.0–0.7)
Eosinophils Relative: 1 % (ref 0–5)
HEMATOCRIT: 42.3 % (ref 39.0–52.0)
HEMOGLOBIN: 14.6 g/dL (ref 13.0–17.0)
LYMPHS PCT: 27 % (ref 12–46)
Lymphs Abs: 1.8 10*3/uL (ref 0.7–4.0)
MCH: 29.2 pg (ref 26.0–34.0)
MCHC: 34.5 g/dL (ref 30.0–36.0)
MCV: 84.6 fL (ref 78.0–100.0)
MPV: 9.5 fL (ref 8.6–12.4)
Monocytes Absolute: 0.3 10*3/uL (ref 0.1–1.0)
Monocytes Relative: 5 % (ref 3–12)
NEUTROS ABS: 4.5 10*3/uL (ref 1.7–7.7)
NEUTROS PCT: 67 % (ref 43–77)
Platelets: 205 10*3/uL (ref 150–400)
RBC: 5 MIL/uL (ref 4.22–5.81)
RDW: 14 % (ref 11.5–15.5)
WBC: 6.7 10*3/uL (ref 4.0–10.5)

## 2015-12-20 LAB — BASIC METABOLIC PANEL
BUN: 19 mg/dL (ref 7–25)
CALCIUM: 9 mg/dL (ref 8.6–10.3)
CO2: 27 mmol/L (ref 20–31)
Chloride: 104 mmol/L (ref 98–110)
Creat: 1.05 mg/dL (ref 0.70–1.33)
GLUCOSE: 91 mg/dL (ref 65–99)
POTASSIUM: 4.5 mmol/L (ref 3.5–5.3)
SODIUM: 137 mmol/L (ref 135–146)

## 2015-12-20 LAB — PROTIME-INR
INR: 1.03 (ref <1.50)
PROTHROMBIN TIME: 13.6 s (ref 11.6–15.2)

## 2015-12-20 NOTE — Progress Notes (Signed)
 Cardiology Office Note    Date:  12/20/2015   ID:  Gabriel Vasquez, DOB 10/27/1965, MRN [id number]  PCP:  Gabriel Vasquez  Cardiologist:   SKAINS, Pranay, MD     History of Present Illness:  Gabriel Vasquez is a 49 y.o. male with history of depression no early family history of CAD who was in the hospital on 11/06/15 with chest pain substernal radiating to his right side, also left side radiating at times the left arm with some numbness and tingling as well. He had intermittent chest pain over the last 9 years. He had a negative stress test about 10 years ago.  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 PPI helps with GERD. 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  Nuclear stress test was abnormal with EKG changes as below. Please see findings.  Past Medical History  Diagnosis Date  . Depression   . Precordial chest pain 11/05/2015  . Chest pain 11/05/2015  . Cervical radiculopathy at C5     Past Surgical History  Procedure Laterality Date  . Vasectomy      Outpatient Prescriptions Prior to Visit  Medication Sig Dispense Refill  . clonazePAM (KLONOPIN) 0.5 MG tablet Take 0.25 mg by mouth 2 (two) times daily as needed for anxiety.   0  . FLUoxetine (PROZAC) 20 MG capsule Take 20 mg by mouth daily as needed (anxiety).   1  . pantoprazole (PROTONIX) 40 MG tablet Take 1 tablet (40 mg total) by mouth daily. 30 tablet 2   No facility-administered medications prior to visit.     Allergies:   Review of patient's allergies indicates no known allergies.   Social History   Social History  . Marital Status: Married    Spouse Name: N/A  . Number of Children: N/A  . Years of Education: N/A   Social History Main Topics  . Smoking status: Never Smoker   . Smokeless tobacco: Never Used  . Alcohol Use: None  . Drug Use: No  . Sexual Activity: Yes   Other Topics Concern  . None   Social History Narrative      Family History:  The patient's family history includes Hypertension in his mother.  Gfather 77 MI ROS:   Please see the history of present illness.    ROS All other systems reviewed and are negative.   PHYSICAL EXAM:   VS:  BP 114/76 mmHg  Pulse 66  Ht 5' 10" (1.778 m)  Wt 224 lb 3.2 oz (101.696 kg)  BMI 32.17 kg/m2   GEN: Well nourished, well developed, in no acute distress HEENT: normal Neck: no JVD, carotid bruits, or masses Cardiac: RRR; no murmurs, rubs, or gallops,no edema  Respiratory:  clear to auscultation bilaterally, normal work of breathing GI: soft, nontender, nondistended, + BS MS: no deformity or atrophy Skin: warm and dry, no rash Neuro:  Alert and Oriented x 3, Strength and sensation are intact Psych: euthymic mood, full affect  Wt Readings from Last 3 Encounters:  12/20/15 224 lb 3.2 oz (101.696 kg)  12/10/15 221 lb (100.245 kg)  12/05/15 221 lb 9.6 oz (100.517 kg)      Studies/Labs Reviewed:   EKG:  11/05/15 shows sinus rhythm with nonspecific ST-T wave changes.  Recent Labs: 11/06/2015: ALT 27; BUN 19; Creatinine, Ser 0.94; Hemoglobin 14.0; Platelets 176; Potassium 4.2; Sodium 141   Lipid Panel    Component Value Date/Time     CHOL 194 12/05/2015 1143   TRIG 91 12/05/2015 1143   HDL 41 12/05/2015 1143   CHOLHDL 4.7 12/05/2015 1143   VLDL 18 12/05/2015 1143   LDLCALC 135* 12/05/2015 1143    Additional studies/ records that were reviewed today include:  Hospital records reviewed, echocardiogram reviewed  NUC stress: 12/10/15   Nuclear stress EF: 45%.   Horizontal ST segment depression ST segment depression of 2 mm was noted during stress in the V5, V6, II, III, aVF and V4 leads.   This is an intermediate risk study based upon decreased EF and abnormal ETT.   Exercise duration (min)10 min   Had fleeting chest pain during stress   There was no obvious perfusion defect at stress. TID 1.13 (normal).   The following segments are  hypokinetic: mid inferolateral.  Have him come on in to discuss. Will discuss possible cardiac cath.  SKAINS, [name], MD  ASSESSMENT:    1. Other chest pain   2. Cervical radiculopathy at C5      PLAN:  In order of problems listed above:  Chest pain-Although nuclear stress test does not show any significant ischemia, he does have what appears to be a basal inferior fixed defect. This corroborates with his echocardiogram showing basal inferior hypokinesis. Ejection fraction was 45%. ST segment depression was noted. Perhaps he has an occluded artery with collateral flow. We will go ahead and set him up for cardiac catheterization.. His echocardiogram does show hypokinesis of the inferior wall. Ejection fraction low normal. 50-55%. He had nonspecific ST-T wave changes on EKG. risks and benefits of cardiac catheterization of been discussed including stroke, heart attack, death, renal impairment.  His chest pain is quite atypical, sharp, pinpoint at times. Sounds musculoskeletal however.  Cervical disc disease-certainly some of his symptoms, numbness at times could be radiculopathy. We discussed.  He was anxious about what he read on the Internet about his echocardiogram. Diastolic dysfunction.    Medication Adjustments/Labs and Tests Ordered: Current medicines are reviewed at length with the patient today.  Concerns regarding medicines are outlined above.  Medication changes, Labs and Tests ordered today are listed in the Patient Instructions below. There are no Patient Instructions on file for this visit.     Signed, SKAINS, Favio, MD  12/20/2015 9:57 AM    [facility] [address], , [place]  [address] Phone: [phone]; Fax: [phone]    

## 2015-12-20 NOTE — Patient Instructions (Signed)
Medication Instructions:  The current medical regimen is effective;  continue present plan and medications.  Labwork: Please have blood work today. (BMP, CBC and PT)  Testing/Procedures: Your physician has requested that you have a cardiac catheterization. Cardiac catheterization is used to diagnose and/or treat various heart conditions. Doctors may recommend this procedure for a number of different reasons. The most common reason is to evaluate chest pain. Chest pain can be a symptom of coronary artery disease (CAD), and cardiac catheterization can show whether plaque is narrowing or blocking your heart's arteries. This procedure is also used to evaluate the valves, as well as measure the blood flow and oxygen levels in different parts of your heart. For further information please visit [email or web address] Please follow instruction sheet, as given.  Follow-Up: Follow up approximately 2 weeks after your heart cath.  If you need a refill on your cardiac medications before your next appointment, please call your pharmacy.  Thank you for choosing [facility]!!

## 2015-12-20 NOTE — Addendum Note (Signed)
Addended by: Tonita PhoenixBOWDEN, Moataz Tavis K on: 12/20/2015 10:16 AM   Modules accepted: Orders

## 2015-12-20 NOTE — Addendum Note (Signed)
Addended by: Tonita PhoenixBOWDEN, Jeanni Allshouse K on: 12/20/2015 10:15 AM   Modules accepted: Orders

## 2015-12-20 NOTE — Addendum Note (Signed)
Addended by: BOWDEN, ROBIN K on: 12/20/2015 10:16 AM   Modules accepted: Orders  

## 2015-12-21 NOTE — Addendum Note (Signed)
Addended by: Donato SchultzSKAINS, Leevon C on: 12/21/2015 10:17 AM   Modules accepted: Orders

## 2015-12-24 ENCOUNTER — Ambulatory Visit (HOSPITAL_COMMUNITY)
Admission: RE | Admit: 2015-12-24 | Discharge: 2015-12-24 | Disposition: A | Discharge status: Home / Self Care | Source: Ambulatory Visit | Attending: Cardiology | Admitting: Cardiology

## 2015-12-24 ENCOUNTER — Encounter (HOSPITAL_COMMUNITY): Payer: Self-pay | Admitting: Cardiology

## 2015-12-24 ENCOUNTER — Encounter (HOSPITAL_COMMUNITY)
Admission: RE | Disposition: A | Discharge status: Home / Self Care | Payer: Self-pay | Source: Ambulatory Visit | Attending: Cardiology

## 2015-12-24 DIAGNOSIS — R9439 Abnormal result of other cardiovascular function study: Secondary | ICD-10-CM | POA: Insufficient documentation

## 2015-12-24 DIAGNOSIS — R0789 Other chest pain: Secondary | ICD-10-CM | POA: Diagnosis not present

## 2015-12-24 DIAGNOSIS — F329 Major depressive disorder, single episode, unspecified: Secondary | ICD-10-CM | POA: Diagnosis not present

## 2015-12-24 DIAGNOSIS — Z8249 Family history of ischemic heart disease and other diseases of the circulatory system: Secondary | ICD-10-CM | POA: Diagnosis not present

## 2015-12-24 DIAGNOSIS — M5412 Radiculopathy, cervical region: Secondary | ICD-10-CM | POA: Insufficient documentation

## 2015-12-24 DIAGNOSIS — K219 Gastro-esophageal reflux disease without esophagitis: Secondary | ICD-10-CM | POA: Insufficient documentation

## 2015-12-24 HISTORY — PX: CARDIAC CATHETERIZATION: SHX172

## 2015-12-24 SURGERY — LEFT HEART CATH AND CORONARY ANGIOGRAPHY

## 2015-12-24 MED ORDER — HEPARIN (PORCINE) IN NACL 2-0.9 UNIT/ML-% IJ SOLN
INTRAMUSCULAR | Status: AC
  Filled 2015-12-24: qty 500

## 2015-12-24 MED ORDER — MIDAZOLAM HCL 2 MG/2ML IJ SOLN
INTRAMUSCULAR | Status: AC
  Filled 2015-12-24: qty 2

## 2015-12-24 MED ORDER — HEPARIN (PORCINE) IN NACL 2-0.9 UNIT/ML-% IJ SOLN
INTRAMUSCULAR | Status: AC
  Filled 2015-12-24: qty 1000

## 2015-12-24 MED ORDER — LIDOCAINE HCL (PF) 1 % IJ SOLN
INTRAMUSCULAR | Status: AC
  Filled 2015-12-24: qty 30

## 2015-12-24 MED ORDER — FENTANYL CITRATE (PF) 100 MCG/2ML IJ SOLN
INTRAMUSCULAR | Status: DC | PRN
  Administered 2015-12-24: 25 ug via INTRAVENOUS

## 2015-12-24 MED ORDER — ASPIRIN 81 MG PO CHEW
81.0000 mg | CHEWABLE_TABLET | ORAL | Status: AC
  Administered 2015-12-24: 81 mg via ORAL

## 2015-12-24 MED ORDER — VERAPAMIL HCL 2.5 MG/ML IV SOLN
INTRAVENOUS | Status: DC | PRN
  Administered 2015-12-24: 11:00:00 via INTRA_ARTERIAL

## 2015-12-24 MED ORDER — SODIUM CHLORIDE 0.9% FLUSH
3.0000 mL | INTRAVENOUS | Status: DC | PRN
Start: 2015-12-24 — End: 2015-12-24

## 2015-12-24 MED ORDER — LIDOCAINE HCL (PF) 1 % IJ SOLN
INTRAMUSCULAR | Status: DC | PRN
  Administered 2015-12-24: 2 mL via SUBCUTANEOUS

## 2015-12-24 MED ORDER — HEPARIN (PORCINE) IN NACL 2-0.9 UNIT/ML-% IJ SOLN
INTRAMUSCULAR | Status: DC | PRN
  Administered 2015-12-24: 11:00:00

## 2015-12-24 MED ORDER — ASPIRIN 81 MG PO CHEW
CHEWABLE_TABLET | ORAL | Status: AC
  Filled 2015-12-24: qty 1

## 2015-12-24 MED ORDER — SODIUM CHLORIDE 0.9 % WEIGHT BASED INFUSION: 1.0000 mL/kg/h | INTRAVENOUS | Status: DC

## 2015-12-24 MED ORDER — SODIUM CHLORIDE 0.9 % WEIGHT BASED INFUSION
3.0000 mL/kg/h | INTRAVENOUS | Status: DC
  Administered 2015-12-24: 3 mL/kg/h via INTRAVENOUS

## 2015-12-24 MED ORDER — IOPAMIDOL (ISOVUE-370) INJECTION 76%
INTRAVENOUS | Status: AC
  Filled 2015-12-24: qty 100

## 2015-12-24 MED ORDER — IOPAMIDOL (ISOVUE-370) INJECTION 76%
INTRAVENOUS | Status: DC | PRN
  Administered 2015-12-24: 70 mL

## 2015-12-24 MED ORDER — VERAPAMIL HCL 2.5 MG/ML IV SOLN
INTRAVENOUS | Status: AC
  Filled 2015-12-24: qty 2

## 2015-12-24 MED ORDER — FENTANYL CITRATE (PF) 100 MCG/2ML IJ SOLN
INTRAMUSCULAR | Status: AC
  Filled 2015-12-24: qty 2

## 2015-12-24 MED ORDER — ASPIRIN 81 MG PO CHEW: 81.0000 mg | CHEWABLE_TABLET | ORAL | Status: DC

## 2015-12-24 MED ORDER — SODIUM CHLORIDE 0.9% FLUSH
3.0000 mL | Freq: Two times a day (BID) | INTRAVENOUS | Status: DC
Start: 2015-12-24 — End: 2015-12-24

## 2015-12-24 MED ORDER — HEPARIN SODIUM (PORCINE) 1000 UNIT/ML IJ SOLN
INTRAMUSCULAR | Status: DC | PRN
  Administered 2015-12-24: 5000 [IU] via INTRAVENOUS

## 2015-12-24 MED ORDER — SODIUM CHLORIDE 0.9 % IV SOLN
250.0000 mL | INTRAVENOUS | Status: DC | PRN
Start: 2015-12-24 — End: 2015-12-24

## 2015-12-24 MED ORDER — MIDAZOLAM HCL 2 MG/2ML IJ SOLN
INTRAMUSCULAR | Status: DC | PRN
  Administered 2015-12-24: 2 mg via INTRAVENOUS

## 2015-12-24 SURGICAL SUPPLY — 11 items
CATH INFINITI 5 FR JL3.5 (CATHETERS) ×2 IMPLANT
CATH INFINITI 5FR ANG PIGTAIL (CATHETERS) ×4 IMPLANT
CATH INFINITI JR4 5F (CATHETERS) ×2 IMPLANT
DEVICE RAD COMP TR BAND LRG (VASCULAR PRODUCTS) ×2 IMPLANT
GLIDESHEATH SLEND SS 6F .021 (SHEATH) ×2 IMPLANT
KIT HEART LEFT (KITS) ×2 IMPLANT
PACK CARDIAC CATHETERIZATION (CUSTOM PROCEDURE TRAY) ×2 IMPLANT
SYR MEDRAD MARK V 150ML (SYRINGE) ×2 IMPLANT
TRANSDUCER W/STOPCOCK (MISCELLANEOUS) ×2 IMPLANT
TUBING CIL FLEX 10 FLL-RA (TUBING) ×2 IMPLANT
WIRE SAFE-T 1.5MM-J .035X260CM (WIRE) ×2 IMPLANT

## 2015-12-24 NOTE — Research (Signed)
CAD LAD Informed Consent   Subject Name: Gabriel Vasquez  Subject met inclusion and exclusion criteria.  The informed consent form, study requirements and expectations were reviewed with the subject and questions and concerns were addressed prior to the signing of the consent form.  The subject verbalized understanding of the trail requirements.  The subject agreed to participate in the CAD LAD trial and signed the informed consent.  The informed consent was obtained prior to performance of any protocol-specific procedures for the subject.  A copy of the signed informed consent was given to the subject and a copy was placed in the subject's medical record.  Hedrick,Natthew Marlatt W 12/24/2015, [id number]

## 2015-12-24 NOTE — H&P (View-Only) (Signed)
Cardiology Office Note    Date:  12/20/2015   ID:  Gabriel Vasquez, DOB 1965/12/14, MRN [id number]  PCP:  Wilfrid Lund, PA  Cardiologist:   Donato Schultz, MD     History of Present Illness:  Gabriel Vasquez is a 50 y.o. male with history of depression no early family history of CAD who was in the hospital on 11/06/15 with chest pain substernal radiating to his right side, also left side radiating at times the left arm with some numbness and tingling as well. He had intermittent chest pain over the last 9 years. He had a negative stress test about 10 years ago.  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 PPI helps with GERD. Cervical spine disease noted  He works in [place], creates highway signs, maintains about 13,000 steps a day. Quite active.  Nuclear stress test was abnormal with EKG changes as below. Please see findings.  Past Medical History  Diagnosis Date  . Depression   . Precordial chest pain 11/05/2015  . Chest pain 11/05/2015  . Cervical radiculopathy at C5     Past Surgical History  Procedure Laterality Date  . Vasectomy      Outpatient Prescriptions Prior to Visit  Medication Sig Dispense Refill  . clonazePAM (KLONOPIN) 0.5 MG tablet Take 0.25 mg by mouth 2 (two) times daily as needed for anxiety.   0  . FLUoxetine (PROZAC) 20 MG capsule Take 20 mg by mouth daily as needed (anxiety).   1  . pantoprazole (PROTONIX) 40 MG tablet Take 1 tablet (40 mg total) by mouth daily. 30 tablet 2   No facility-administered medications prior to visit.     Allergies:   Review of patient's allergies indicates no known allergies.   Social History   Social History  . Marital Status: Married    Spouse Name: N/A  . Number of Children: N/A  . Years of Education: N/A   Social History Main Topics  . Smoking status: Never Smoker   . Smokeless tobacco: Never Used  . Alcohol Use: None  . Drug Use: No  . Sexual Activity: Yes   Other Topics Concern  . None   Social History Narrative      Family History:  The patient's family history includes Hypertension in his mother.  Gfather 77 MI ROS:   Please see the history of present illness.    ROS All other systems reviewed and are negative.   PHYSICAL EXAM:   VS:  BP 114/76 mmHg  Pulse 66  Ht  (1.778 m)  Wt 224 lb 3.2 oz (101.696 kg)  BMI 32.17 kg/m2   GEN: Well nourished, well developed, in no acute distress HEENT: normal Neck: no JVD, carotid bruits, or masses Cardiac: RRR; no murmurs, rubs, or gallops,no edema  Respiratory:  clear to auscultation bilaterally, normal work of breathing GI: soft, nontender, nondistended, + BS MS: no deformity or atrophy Skin: warm and dry, no rash Neuro:  Alert and Oriented x 3, Strength and sensation are intact Psych: euthymic mood, full affect  Wt Readings from Last 3 Encounters:  12/20/15 224 lb 3.2 oz (101.696 kg)  12/10/15 221 lb (100.245 kg)  12/05/15 221 lb 9.6 oz (100.517 kg)      Studies/Labs Reviewed:   EKG:  11/05/15 shows sinus rhythm with nonspecific ST-T wave changes.  Recent Labs: 11/06/2015: ALT 27; BUN 19; Creatinine, Ser 0.94; Hemoglobin 14.0; Platelets 176; Potassium 4.2; Sodium 141   Lipid Panel    Component Value Date/Time  CHOL 194 12/05/2015 1143   TRIG 91 12/05/2015 1143   HDL 41 12/05/2015 1143   CHOLHDL 4.7 12/05/2015 1143   VLDL 18 12/05/2015 1143   LDLCALC 135* 12/05/2015 1143    Additional studies/ records that were reviewed today include:  Hospital records reviewed, echocardiogram reviewed  NUC stress: 12/10/15   Nuclear stress EF: 45%.   Horizontal ST segment depression ST segment depression of 2 mm was noted during stress in the V5, V6, II, III, aVF and V4 leads.   This is an intermediate risk study based upon decreased EF and abnormal ETT.   Exercise duration (min)10 min   Had fleeting chest pain during stress   There was no obvious perfusion defect at stress. TID 1.13 (normal).   The following segments are  hypokinetic: mid inferolateral.  Have him come on in to discuss. Will discuss possible cardiac cath.  Donato SchultzSKAINS, [place], MD  ASSESSMENT:    1. Other chest pain   2. Cervical radiculopathy at C5      PLAN:  In order of problems listed above:  Chest pain-Although nuclear stress test does not show any significant ischemia, he does have what appears to be a basal inferior fixed defect. This corroborates with his echocardiogram showing basal inferior hypokinesis. Ejection fraction was 45%. ST segment depression was noted. Perhaps he has an occluded artery with collateral flow. We will go ahead and set him up for cardiac catheterization.. His echocardiogram does show hypokinesis of the inferior wall. Ejection fraction low normal. 50-55%. He had nonspecific ST-T wave changes on EKG. risks and benefits of cardiac catheterization of been discussed including stroke, heart attack, death, renal impairment.  His chest pain is quite atypical, sharp, pinpoint at times. Sounds musculoskeletal however.  Cervical disc disease-certainly some of his symptoms, numbness at times could be radiculopathy. We discussed.  He was anxious about what he read on the Internet about his echocardiogram. Diastolic dysfunction.    Medication Adjustments/Labs and Tests Ordered: Current medicines are reviewed at length with the patient today.  Concerns regarding medicines are outlined above.  Medication changes, Labs and Tests ordered today are listed in the Patient Instructions below. There are no Patient Instructions on file for this visit.     Mathews RobinsonsSigned, Paullette Mckain, [place], MD  12/20/2015 9:57 AM    [facility] [address], [place], [place]  [address] Phone: [phone]; Fax: [phone]

## 2015-12-24 NOTE — Discharge Instructions (Signed)
Radial Site Care °Refer to this sheet in the next few weeks. These instructions provide you with information about caring for yourself after your procedure. Your health care provider may also give you more specific instructions. Your treatment has been planned according to current medical practices, but problems sometimes occur. Call your health care provider if you have any problems or questions after your procedure. °WHAT TO EXPECT AFTER THE PROCEDURE °After your procedure, it is typical to have the following: °· Bruising at the radial site that usually fades within 1-2 weeks. °· Blood collecting in the tissue (hematoma) that may be painful to the touch. It should usually decrease in size and tenderness within 1-2 weeks. °HOME CARE INSTRUCTIONS °· Take medicines only as directed by your health care provider. °· You may shower 24-48 hours after the procedure or as directed by your health care provider. Remove the bandage (dressing) and gently wash the site with plain soap and water. Pat the area dry with a clean towel. Do not rub the site, because this may cause bleeding. °· Do not take baths, swim, or use a hot tub until your health care provider approves. °· Check your insertion site every day for redness, swelling, or drainage. °· Do not apply powder or lotion to the site. °· Do not flex or bend the affected arm for 24 hours or as directed by your health care provider. °· Do not push or pull heavy objects with the affected arm for 24 hours or as directed by your health care provider. °· Do not lift over 10 lb (4.5 kg) for 5 days after your procedure or as directed by your health care provider. °· Ask your health care provider when it is okay to: °¨ Return to work or school. °¨ Resume usual physical activities or sports. °¨ Resume sexual activity. °· Do not drive home if you are discharged the same day as the procedure. Have someone else drive you. °· You may drive 24 hours after the procedure unless otherwise  instructed by your health care provider. °· Do not operate machinery or power tools for 24 hours after the procedure. °· If your procedure was done as an outpatient procedure, which means that you went home the same day as your procedure, a responsible adult should be with you for the first 24 hours after you arrive home. °· Keep all follow-up visits as directed by your health care provider. This is important. °SEEK MEDICAL CARE IF: °· You have a fever. °· You have chills. °· You have increased bleeding from the radial site. Hold pressure on the site. °SEEK IMMEDIATE MEDICAL CARE IF: °· You have unusual pain at the radial site. °· You have redness, warmth, or swelling at the radial site. °· You have drainage (other than a small amount of blood on the dressing) from the radial site. °· The radial site is bleeding, and the bleeding does not stop after 30 minutes of holding steady pressure on the site. °· Your arm or hand becomes pale, cool, tingly, or numb. °  °This information is not intended to replace advice given to you by your health care provider. Make sure you discuss any questions you have with your health care provider. °  °Document Released: 10/18/2010 Document Revised: 10/06/2014 Document Reviewed: 04/03/2014 °Elsevier Interactive Patient Education ©2016 [employer] Inc[employer] ° °

## 2015-12-24 NOTE — Interval H&P Note (Signed)
History and Physical Interval Note:  12/24/2015 10:50 AM  Gabriel Vasquez  has presented today for surgery, with the diagnosis of cp  The various methods of treatment have been discussed with the patient and family. After consideration of risks, benefits and other options for treatment, the patient has consented to  Procedure(s): Left Heart Cath and Coronary Angiography (N/A) as a surgical intervention .  The patient's history has been reviewed, patient examined, no change in status, stable for surgery.  I have reviewed the patient's chart and labs.  Questions were answered to the patient's satisfaction.    Cath Lab Visit (complete for each Cath Lab visit)  Clinical Evaluation Leading to the Procedure:   ACS: No.  Non-ACS:    Anginal Classification: CCS II  Anti-ischemic medical therapy: No Therapy  Non-Invasive Test Results: Intermediate-risk stress test findings: cardiac mortality 1-3%/year  Prior CABG: No previous CABG         Gabriel Vasquez, Geoffery

## 2016-07-01 ENCOUNTER — Ambulatory Visit (INDEPENDENT_AMBULATORY_CARE_PROVIDER_SITE_OTHER): Admitting: Neurology

## 2016-07-01 ENCOUNTER — Encounter: Payer: Self-pay | Admitting: Neurology

## 2016-07-01 VITALS — BP 120/82 | HR 68 | Resp 20 | Ht 70.0 in | Wt 237.0 lb

## 2016-07-01 DIAGNOSIS — G4710 Hypersomnia, unspecified: Secondary | ICD-10-CM

## 2016-07-01 DIAGNOSIS — G4730 Sleep apnea, unspecified: Secondary | ICD-10-CM | POA: Diagnosis not present

## 2016-07-01 DIAGNOSIS — R51 Headache: Secondary | ICD-10-CM | POA: Diagnosis not present

## 2016-07-01 DIAGNOSIS — R519 Headache, unspecified: Secondary | ICD-10-CM

## 2016-07-01 DIAGNOSIS — J301 Allergic rhinitis due to pollen: Secondary | ICD-10-CM | POA: Diagnosis not present

## 2016-07-01 DIAGNOSIS — G4733 Obstructive sleep apnea (adult) (pediatric): Secondary | ICD-10-CM | POA: Diagnosis not present

## 2016-07-01 NOTE — Progress Notes (Signed)
SLEEP MEDICINE CLINIC   Provider:  Melvyn Novasarmen  Gaynelle Pastrana, M D  Referring Provider: Lindaann PascalScott Long, [place]  Primary Care Physician:   Chief Complaint  Patient presents with  . New Patient (Initial Visit)    had sleep study has used cpap in the past    HPI:  Gabriel Vasquez is a 50 y.o. male , seen here as a referral from [place],  Gabriel Vasquez reports that he was evaluated for sleep apnea and diagnosed with sleep apnea around age 50, and his CPAP was prescribed to him. Initially he was a compliant user and over the years he felt that the CPAP has become more of a burden. The machine is no longer found, he has moved and he is not even sure where he stores it at this time. However it would have not been used for over 5 years. Gabriel Vasquez was tested and treated in [place]. He also reported sinus and rhinitis problems but these have not culminated to epistaxis. Sometimes he has a moderate degree headache in the frontal sinus region, sometimes a sore throat, but it is  his wife, who is  concerned about his very loud, thunderous snoring, which keeps her from sleeping and arrange for this visit.   Sleep habits are as follows: Mr. Gabriel Vasquez share a bedroom but it has been hard for Mrs. Scoles to find sleep. He usually can go to sleep at any time and is rather promptly asleep and he reports sleeping so deep that it knocked on the door or a light nudge will not wake him. He will go to bed between 9:30 and 10:00, will be finding himself asleep promptly. He will start of on his side but usually finds himself at some time during the night on his back. He will sleep on one pillow. The bedroom is cool and quiet and dark. He does not have nocturia and can usually sleeps through the night until the morning hours. He will arise by 5:30 or 6 AM, used to be spontaneously arousing but now needs an alarm. He will even hits the snooze button  multiple times- craving a little extra sleep. He feels neither fully refreshed  nor restored in the morning. He does not feel that he is necessarily suffering from a dry mouth but he recently had more sinus headaches ( these are seasonal, about 8 month per year). He wakes up with headaches, and finds himself a mouth breather.    Sleep medical history and family sleep history:  No family member with OSA known to him.  Gabriel Vasquez was a sleep walker during his childhood years, he may have had some sleep terrors as well. He was also a bedwetter. He would dream that he was actually in the bathroom and wake up to find his bed soiled.   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 He is married, and follow 3 children. 52 year old daughter has mental disability, his mother is living with them , too. His 52 year old son has graduated .   Lifelong non- smoker,  Beer drinker , 4-6 a week. Caffeine - coffee - 2-3 cups a day. Also he has restricted his caffeine use normally to the morning hours, he reports that he can drink coffee at night and has to go to bed.  Review of Systems: Out of a complete 14 system review, the patient complains of  only the following symptoms, and all other reviewed systems are negative. Snoring, nasal congestion, frontal headaches.   Epworth score  15 , Fatigue severity score 37  , depression score n/a  ( under treatment)   How likely are you to doze in the following situations: 0 = not likely, 1 = slight chance, 2 = moderate chance, 3 = high chance  Sitting and Reading? 2 Watching Television? 3 Sitting inactive in a public place (theater or meeting)?1 As a passenger in a car for an hour without a break?3 Lying down in the afternoon when circumstances permit?3 Sitting and talking to someone?0 Sitting quietly after lunch without alcohol?1 In a car, while stopped for a few minutes in traffic?1   Total = 15     Social History   Social History  . Marital  status: Married    Spouse name: N/A  . Number of children: N/A  . Years of education: N/A   Occupational History  . Not on file.   Social History Main Topics  . Smoking status: Never Smoker  . Smokeless tobacco: Never Used  . Alcohol use 1.2 oz/week    2 Cans of beer per week  . Drug use: No  . Sexual activity: Yes   Other Topics Concern  . Not on file   Social History Narrative  . No narrative on file    Family History  Problem Relation Age of Onset  . Hypertension Mother   . Angina Maternal Grandfather     Past Medical History:  Diagnosis Date  . Cervical radiculopathy at C5   . Chest pain 11/05/2015  . Depression   . Hemorrhoids   . Mixed anxiety and depressive disorder   . Precordial chest pain 11/05/2015    Past Surgical History:  Procedure Laterality Date  . CARDIAC CATHETERIZATION N/A 12/24/2015   Procedure: Left Heart Cath and Coronary Angiography;  Surgeon: Jake Bathe, MD;  Location: [facility] INVASIVE CV LAB;  Service: Cardiovascular;  Laterality: N/A;  . VASECTOMY      Current Outpatient Prescriptions  Medication Sig Dispense Refill  . clonazePAM (KLONOPIN) 0.5 MG tablet Take 0.25 mg by mouth 2 (two) times daily as needed for anxiety.   0  . FLUoxetine (PROZAC) 20 MG capsule Take 20 mg by mouth daily as needed (anxiety).   1   No current facility-administered medications for this visit.     Allergies as of 07/01/2016  . (No Known Allergies)    Vitals: BP 120/82   Pulse 68   Resp 20   Ht 5\' 10"  (1.778 m)   Wt 237 lb (107.5 kg)   BMI 34.01 kg/m  Last Weight:  Wt Readings from Last 1 Encounters:  07/01/16 237 lb (107.5 kg)   ZOX:WRUE mass index is 34.01 kg/m.     Last Height:   Ht Readings from Last 1 Encounters:  07/01/16 5\' 10"  (1.778 m)    Physical exam:  General: The patient is awake, alert and appears not in acute distress. The patient is well groomed. Head: Normocephalic, atraumatic. Neck is supple. Mallampati 3,  neck  circumference:17.5 . Nasal airflow congested , TMJ click and pop on the left side evident . Retrognathia is seen.  Cardiovascular:  Regular rate and rhythm without  murmurs or carotid bruit, and without distended neck veins. Respiratory: Lungs are clear to auscultation. Skin:  Without evidence of edema, or rash Trunk: BMI is elevated .    Neurologic exam : The patient is awake  and alert, oriented to place and time.   Attention span & concentration ability appears normal.  Speech is fluent,  without dysarthria, dysphonia or aphasia.  Mood and affect are appropriate.  Cranial nerves: Pupils are equal and briskly reactive to light. Funduscopic exam without evidence of pallor or edema.  Extraocular movements  in vertical and horizontal planes intact and without nystagmus. Visual fields by finger perimetry are intact. Hearing to finger rub intact. Facial sensation intact to fine touch.Facial motor strength is symmetric and tongue and uvula move midline. Shoulder shrug was symmetrical.   Motor exam:  Normal tone, muscle bulk and symmetric strength in all extremities. Sensory:  Fine touch, pinprick and vibration  were tested in all extremities. Proprioception tested in the upper extremities was normal. Coordination: Rapid alternating movements in the fingers/hands was normal. Finger-to-nose maneuver  normal without evidence of ataxia, dysmetria or tremor. Gait and station: Patient walks without assistive device and is able unassisted to climb up to the exam table. Strength within normal limits.  Stance is stable and normal.  Tandem gait is unfragmented.  Deep tendon reflexes: in the  upper and lower extremities are symmetric and intact. Babinski maneuver response is downgoing.  The patient was advised of the nature of the diagnosed sleep disorder , the treatment options and risks for general a health and wellness arising from not treating the condition.  I spent more than 40 minutes of face to face  time with the patient. Greater than 50% of time was spent in counseling and coordination of care. We have discussed the diagnosis and differential and I answered the patient's questions.     Assessment:  After physical and neurologic examination, review of laboratory studies,  Personal review of imaging studies, reports of other /same  Imaging studies ,  Results of polysomnography/ neurophysiology testing and pre-existing records as far as provided in visit., my assessment is   1) Mr. Stan is a gentleman with a known history of obstructive sleep apnea, diagnosed about 12 years ago. He does not use CPAP in many years but became excessively daytime sleepy and even a quiet the habit of drinking coffee which he never did before over the last for 5 years. All this is very suspicious for a resurgence of sleep apnea and related hypersomnia as well as high-grade of fatigue. His fatigue severity score was 37 points and his Epworth score 15 points. His wife has witnessed him to snore very loudly.  2) the Hoel has been using Prozac for the last 18 months as prescribed by primary care, often this will control nightmares and vivid dreams as well as parasomnias. He is a childhood history of sleepwalking but not as an adult. 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  3) Mr. Reckart has gained weight and is probably heavier now than he was 12 or 14 years ago. This also will contribute to the development of apnea and it has culminated and louder thunderous snoring as witnessed by his wife. 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 These anatomical factors also play a role in selecting a therapy for sleep apnea. A dental device can help with mild apnea in a case of retrognathia, but can also aggravate the TMJ click and culminate in  pain.   I will order a SPLT night PSG in the lab.     Porfirio Mylar Desmon Hitchner MD  07/01/2016   CC:  Wilfrid Lund, [place] [address] [place], [place] [address]

## 2016-07-01 NOTE — Patient Instructions (Signed)

## 2016-08-08 ENCOUNTER — Ambulatory Visit (INDEPENDENT_AMBULATORY_CARE_PROVIDER_SITE_OTHER): Admitting: Neurology

## 2016-08-08 DIAGNOSIS — G4730 Sleep apnea, unspecified: Secondary | ICD-10-CM

## 2016-08-08 DIAGNOSIS — G4733 Obstructive sleep apnea (adult) (pediatric): Secondary | ICD-10-CM

## 2016-08-08 DIAGNOSIS — G4710 Hypersomnia, unspecified: Secondary | ICD-10-CM

## 2016-08-08 DIAGNOSIS — R519 Headache, unspecified: Secondary | ICD-10-CM

## 2016-08-08 DIAGNOSIS — R51 Headache: Secondary | ICD-10-CM

## 2016-08-08 DIAGNOSIS — J301 Allergic rhinitis due to pollen: Secondary | ICD-10-CM

## 2016-08-20 ENCOUNTER — Telehealth: Payer: Self-pay | Admitting: Neurology

## 2016-08-20 DIAGNOSIS — G4733 Obstructive sleep apnea (adult) (pediatric): Secondary | ICD-10-CM

## 2016-08-20 NOTE — Telephone Encounter (Signed)
I called pt to discuss sleep study results. No answer, left a message asking pt to call me back. 

## 2016-08-20 NOTE — Procedures (Signed)
PATIENT'S NAME:  Gabriel Vasquez, [name] DOB:      1966/06/29      MR#:    [id number]     DATE OF RECORDING: 08/08/2016 REFERRING M.D.:  Lindaann PascalScott Long, PA-C Study Performed:   Baseline Polysomnogram HISTORY:   Mr. Gabriel Vasquez reports that he was evaluated for sleep apnea and diagnosed with sleep apnea around age 50, and CPAP was prescribed. Mr. Gabriel Vasquez was tested and treated in [place]. Initially he was a compliant user, but became non-compliant. Witnessed to have very loud, thunderous snoring and apneas, which keep his wife from sleeping. He has sinus and rhinitis problems, headache in the frontal region, sometimes a sore throat, and EDS, hypersomnia, anxiety.. The patient endorsed the Epworth Sleepiness Scale at 15 points.  The patient's weight 237 pounds with a height of 70 (inches), resulting in a BMI of 34.1 kg/m2.The patient's neck circumference measured 17.5 inches.  CURRENT MEDICATIONS: Klonopin, Prozac   PROCEDURE:  This is a multichannel digital polysomnogram utilizing the Somnostar 11.2 system.  Electrodes and sensors were applied and monitored per AASM Specifications.   EEG, EOG, Chin and Limb EMG, were sampled at 200 Hz.  ECG, Snore and Nasal Pressure, Thermal Airflow, Respiratory Effort, CPAP Flow and Pressure, Oximetry was sampled at 50 Hz. Digital video and audio were recorded.      BASELINE STUDY  Lights Out was at 21:44 and Lights On at 05:04.  Total recording time (TRT) was 440 minutes, with a total sleep time (TST) of  414 minutes.   The patient's sleep latency was 19.5 minutes.  REM latency was 108 minutes.  The sleep efficiency was 94.1 %.     SLEEP ARCHITECTURE: WASO (Wake after sleep onset) was 19 minutes.  There were 4.5 minutes in Stage N1, 279 minutes Stage N2, 33 minutes Stage N3 and 97.5 minutes in Stage REM.  The percentage of Stage N1 was 1.1%, Stage N2 was 67.4%, Stage N3 was 8.0% and Stage R (REM sleep) was 23.6%.   RESPIRATORY ANALYSIS:  There were a total of 144 respiratory  events:  44 obstructive apneas, 1 central apneas and 2 mixed apneas with a total of 47 apneas and an apnea index (AI) of 6.8 /hour. There were 97 hypopneas with a hypopnea index of 14.1 /hour. The patient also had 0 respiratory event related arousals (RERAs). The total APNEA/HYPOPNEA INDEX (AHI) was 20.9/hr. and the total RESPIRATORY DISTURBANCE INDEX was 20.9 /hour.  95 events occurred in REM sleep and 83 events in NREM. The REM AHI was 58.5 /hour, versus a non-REM AHI of 9.3. The patient spent all sleep time in the supine position .  OXYGEN SATURATION & C02:  The Wake baseline 02 saturation was 97%, with the lowest being 78%. Time spent below 89% saturation equaled 14 minutes.  PERIODIC LIMB MOVEMENTS:   The patient had a total of 129 Periodic Limb Movements.  The Periodic Limb Movement (PLM) index was 18.7 and the PLM Arousal index was 0.6/hour. Loud Snoring was noted. EKG was in keeping with normal sinus rhythm (NSR).   IMPRESSION:   1)OSA :  Mr. Gabriel Vasquez has still significant sleep apnea, AHI overall 20.9/hr.   2)Obstructive apnea with strong REM accentuation, REM AHI was 58.5/hr.  3)Hypoxemia: Concerning is a oxygen nadir of 78% , but total desaturation time was short.  4)Sinus Rhythm , no cardiac rhythm abnormalities noted.  5) Moderate snoring, non continuous.   RECOMMENDATIONS: Mr Gabriel Vasquez can either return ASAP for an attended sleep study for titration to  CPAP,  Alternative therapies such as dental device are not helpful in REM dependent apnea or apnea with oxygen desaturation.  Weight loss to be encouraged, this will help snoring and apnea.   1. Advise full-night, attended, CPAP titration study to optimize therapy.   2. Avoid sedative-hypnotics which may worsen sleep apnea, alcohol and tobacco (as applicable). 3. Advise to lose weight, diet and exercise if not contraindicated (BMI 34). 4. Further information regarding OSA may be obtained from [employer]  (www.sleepfoundation.org) or American Sleep Apnea Association (www.sleepapnea.org). 5. A follow up appointment will be scheduled in the Sleep Clinic at [facility]. The referring provider will be notified of the results.      I certify that I have reviewed the entire raw data recording prior to the issuance of this report in accordance with the Standards of Accreditation of the American Academy of Sleep Medicine (AASM)      Melvyn Novasarmen Elissia Spiewak, MD  08-20-2016 Diplomat, American Board of Psychiatry and Neurology  Diplomat, American Board of Sleep Medicine Medical Director, [facility]

## 2016-08-20 NOTE — Telephone Encounter (Signed)
Please tell patient that he has to return for CPAP titration, his apnea is REM dependent and associated with a oxygen nadir of 78%.  Weight loss is recommended. Please let him know that CPAP titration was ordered. CD

## 2016-08-25 NOTE — Telephone Encounter (Signed)
I called pt again to discuss sleep study results. No answer, left a message asking him to call me back. 

## 2016-08-26 NOTE — Telephone Encounter (Signed)
I called pt again to discuss sleep study results. No answer, left a message asking him to call me back. This is my third attempt at reaching pt by phone. If pt has not called me back by tomorrow, I will send him a letter.

## 2016-08-26 NOTE — Telephone Encounter (Signed)
I spoke to pt. I advised him that his sleep study revealed that he had REM dependent apnea and an oxygen nadir of 78%, and that Dr. Vickey Hugerohmeier recommends returning for a cpap titration study. Pt is agreeable to returning for cpap titration study and knows our sleep lab will call her to schedule. Pt verbalized understanding of results. Pt had no questions at this time but was encouraged to call back if questions arise.

## 2016-09-09 NOTE — Telephone Encounter (Signed)
Note closed.

## 2016-09-23 ENCOUNTER — Ambulatory Visit (INDEPENDENT_AMBULATORY_CARE_PROVIDER_SITE_OTHER): Admitting: Neurology

## 2016-09-23 DIAGNOSIS — G4733 Obstructive sleep apnea (adult) (pediatric): Secondary | ICD-10-CM | POA: Diagnosis not present

## 2016-09-24 ENCOUNTER — Telehealth: Payer: Self-pay | Admitting: Neurology

## 2016-09-24 DIAGNOSIS — G4733 Obstructive sleep apnea (adult) (pediatric): Secondary | ICD-10-CM

## 2016-09-24 NOTE — Telephone Encounter (Signed)
PATIENT'S NAME:  Gabriel Vasquez, [name] DOB:      12-Nov-1965      MR#:    [id number]     DATE OF RECORDING: 09/23/2016 REFERRING M.D.:  Lindaann PascalScott Long, [place] Study Performed:   CPAP  Titration HISTORY:  Patient had a sleep study on 08/17/16. The total APNEA/HYPOPNEA INDEX (AHI) was 20.9/hr. The REM AHI was 58.5 /hour, versus a non-REM AHI of 9.3. Patient had a prolonged low SpO2 time with the nadir being 78%. Chest pain, Obesity, hemorrhoids, depressive disorder, EDS and OSA.  The patient endorsed the Epworth Sleepiness Scale at 15/24 points.   The patient's weight 238 pounds with a height of 70 (inches), resulting in a BMI of 34.1 kg/m2. The patient's neck circumference measured 17 inches.  CURRENT MEDICATIONS: Klonopin, Prozac  PROCEDURE:  This is a multichannel digital polysomnogram utilizing the SomnoStar 11.2 system.  Electrodes and sensors were applied and monitored per AASM Specifications.   EEG, EOG, Chin and Limb EMG, were sampled at 200 Hz.  ECG, Snore and Nasal Pressure, Thermal Airflow, Respiratory Effort, CPAP Flow and Pressure, Oximetry was sampled at 50 Hz. Digital video and audio were recorded.      CPAP was initiated at 5 cmH20 with heated humidity per AASM split night standards and pressure was advanced to 11/11cmH20 because of hypopneas, apneas and desaturations.  At a PAP pressure of 11 cmH20, there was a reduction of the AHI to 1.3 with improvement of the above symptoms of obstructive sleep apnea.    Lights Out was at 21:39 and Lights On at 05:29. Total recording time (TRT) was 449 minutes, with a total sleep time (TST) of 418 minutes. The patient's sleep latency was 5 minutes with 0 minutes of wake time after sleep onset. REM latency was 103 minutes.  The sleep efficiency was 93.1 %.    SLEEP ARCHITECTURE: WASO (Wake after sleep onset)  was 29 minutes.  There were 21.5 minutes in Stage N1, 261.5 minutes Stage N2, 0 minutes Stage N3 and 135 minutes in Stage REM.  The percentage of Stage N1  was 5.1%, Stage N2 was 62.6%, Stage N3 was 0% and Stage R (REM sleep) was 32.3%. The sleep architecture was notable for strong REM sleep rebound.  The arousals were noted as: 48 were spontaneous, 0 were associated with PLMs, and 63 were associated with respiratory events. Audio and video analysis did not show any abnormal or unusual movements, behaviors, phonations or vocalizations.   EKG was in keeping with normal sinus rhythm (NSR).   RESPIRATORY ANALYSIS:  There were 63 respiratory events: 7 obstructive apneas, 0 central apneas and 56 hypopneas with 0 respiratory event related arousals (RERAs).      The total APNEA/HYPOPNEA INDEX (AHI) was 9.0 /hour and the total RESPIRATORY DISTURBANCE INDEX was 9.0/hour.   34 events occurred in REM sleep and 29 events in NREM. The REM AHI was 15.1 /hour versus a non-REM AHI of 6.1 /hour.  The patient spent 418 minutes of total sleep time in the supine position and 0 minutes in non-supine. The supine AHI was 9.0, versus a non-supine AHI of 0.0.  OXYGEN SATURATION & C02:  The baseline 02 saturation was 94%, with the lowest being 81%. Time spent below 89% saturation equaled 8 minutes.  PERIODIC LIMB MOVEMENTS:   The patient had a total of 0 Periodic Limb Movements.   DIAGNOSIS 1. . Obstructive Sleep Apnea, with supine and REM accentuation and responding well to CPAP. CPAP pressure of 11 cmH20, with  heated humidity.  The patient was fitted with a [employer]  Air Fit P10 (Large) apparatus. 2. Obesity   PLANS/RECOMMENDATIONS: 1. Obstructive sleep apnea: Recommend a CPAP of 11cm water, heated humidity, with a [employer]  Air Fit P10 (Large) and use of device for 4 hours or more each night, with follow up at 2 to 3 months to assess response.   a. Weight loss if appropriate. b. Avoidance of medications with muscle relaxant properties. c. Avoidance of ingestion of alcohol prior to sleeping. d. Avoiding sleeping in the supine position (on one's back). e. Improvement of  nasal patency if indicated. f. Avoiding driving when sleepy. A follow up appointment will be scheduled in the Sleep Clinic at [facility].   Please call [phone] with any questions.      I certify that I have reviewed the entire raw data recording prior to the issuance of this report in accordance with the Standards of Accreditation of the American Academy of Sleep Medicine (AASM)    Melvyn Novasarmen Jams Trickett, M.D.  09-24-2016 Diplomat, American Board of Psychiatry and Neurology  Diplomat, American Board of Sleep Medicine Medical Director, [facility]

## 2016-09-24 NOTE — Procedures (Signed)
Recommend a CPAP of 11cm water, heated humidity, with a [employer]  Air Fit P10 (Large) and use of device for 4 hours or more each night, with follow up at 2 to 3 months to assess response.

## 2016-09-25 NOTE — Telephone Encounter (Signed)
I spoke to the patient and he is aware of results and recommendations. He is willing to start treatment. I will send orders to DME company. PCP will get a copy of study. Patient will receive a letter reminding him to make f/u appt.

## 2017-09-29 DIAGNOSIS — G4733 Obstructive sleep apnea (adult) (pediatric): Secondary | ICD-10-CM

## 2017-09-29 HISTORY — DX: Obstructive sleep apnea (adult) (pediatric): G47.33

## 2017-12-10 IMAGING — MR MR CERVICAL SPINE WO/W CM
4 of 8 series · 19 of 48 positions shown · IV contrast (Yes)
Comparison: None.

CLINICAL DATA: 49-year-old male with C5 radiculopathy. Neck pain
and left arm pain. No recall of injury. No history of cancer.
Initial encounter.

EXAM:
MRI CERVICAL SPINE WITHOUT AND WITH CONTRAST
TECHNIQUE: Multiplanar and multiecho pulse sequences of the cervical spine, to
include the craniocervical junction and cervicothoracic junction,
were obtained according to standard protocol without and with
intravenous contrast.
CONTRAST:  20mL MULTIHANCE GADOBENATE DIMEGLUMINE 529 MG/ML IV SOLN

[Series 2: T1 · sagittal · 3.0mm · 0.41mm/px · 4 of 12 slices shown (1 of 2)]
[im 1/12]
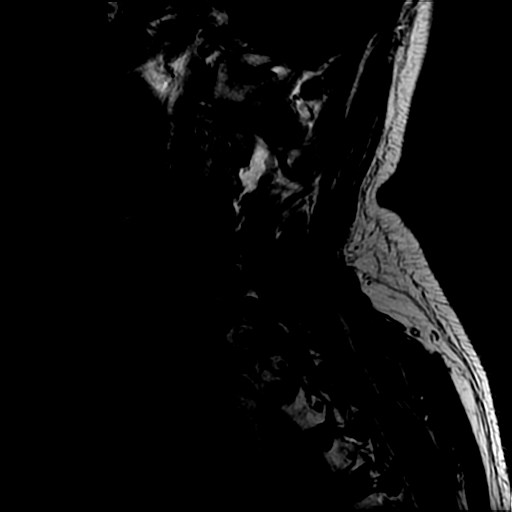
[im 4/12]
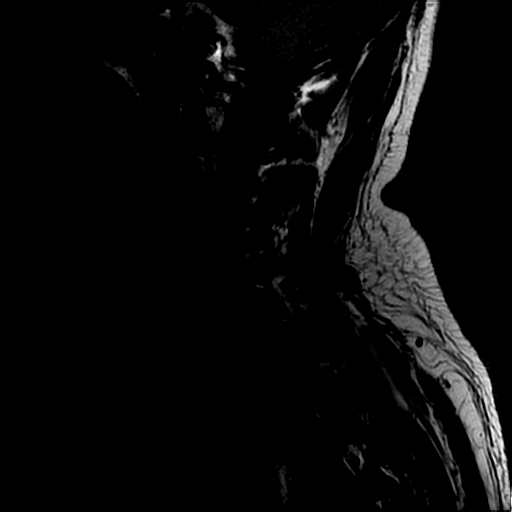
[im 8/12]
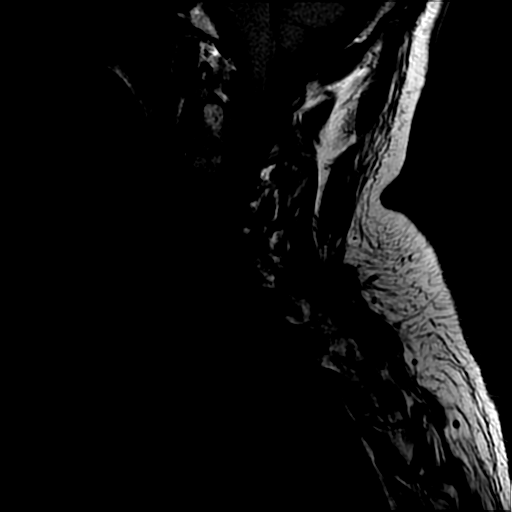
[im 12/12]
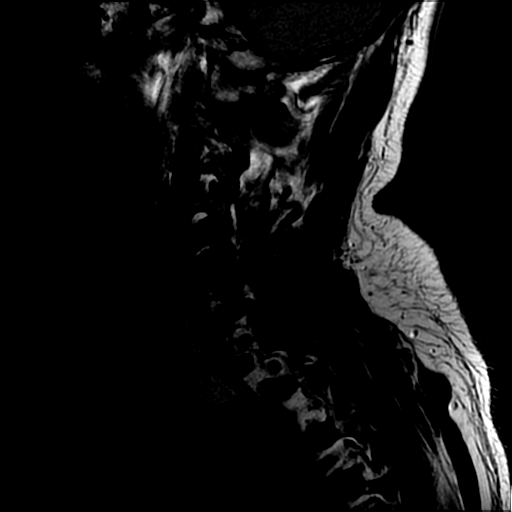

[Series 5: T2 · axial · 3.0mm · 0.35mm/px · z∈[-28,+77]mm · 9 of 31 slices shown]
[im 1/31]
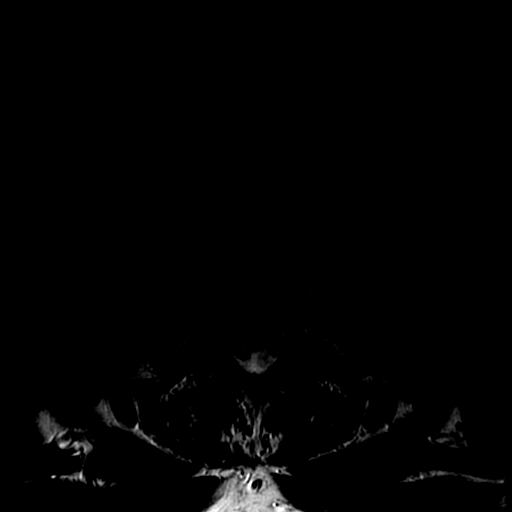
[im 4/31]
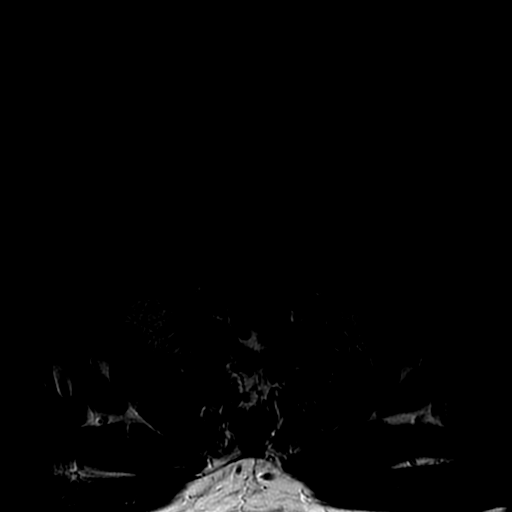
[im 8/31]
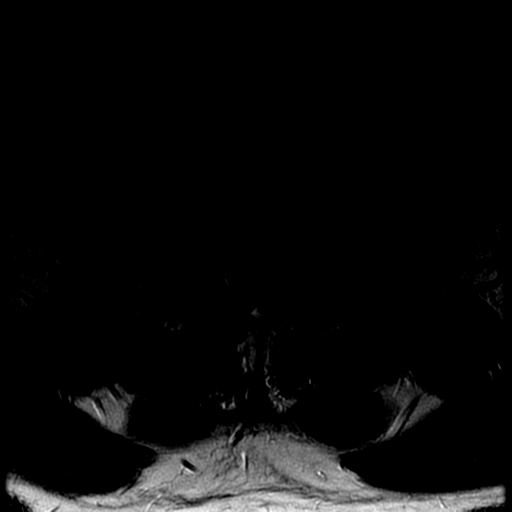
[im 12/31]
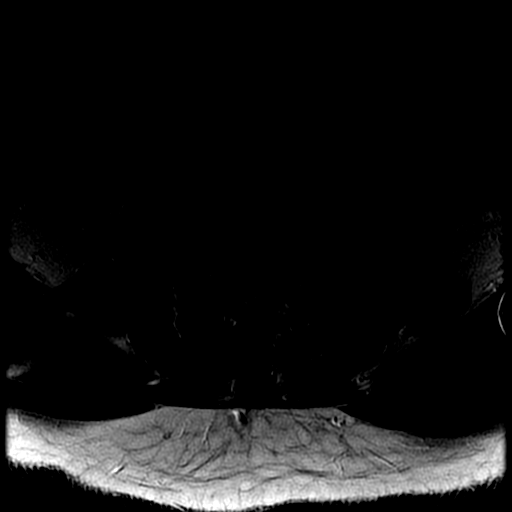
[im 16/31]
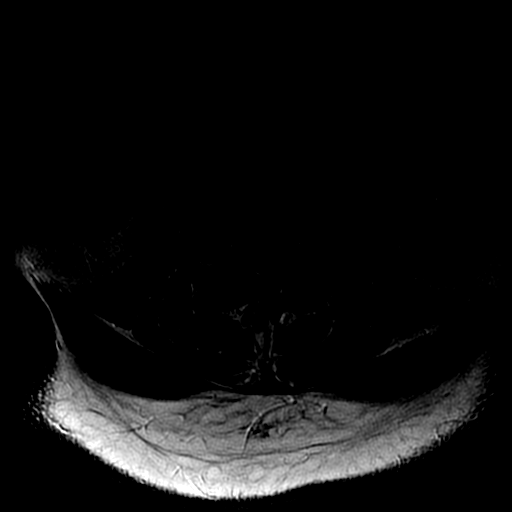
[im 19/31]
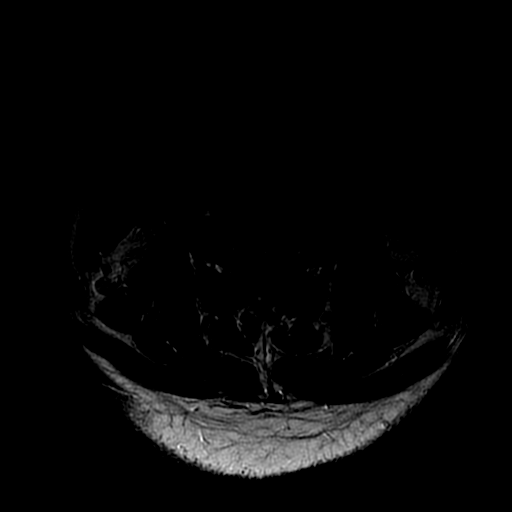
[im 23/31]
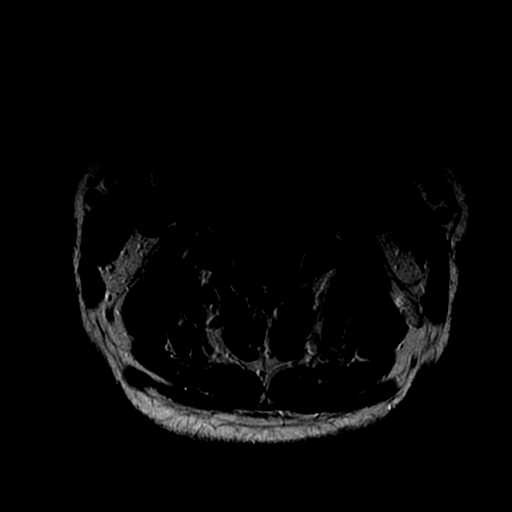
[im 27/31]
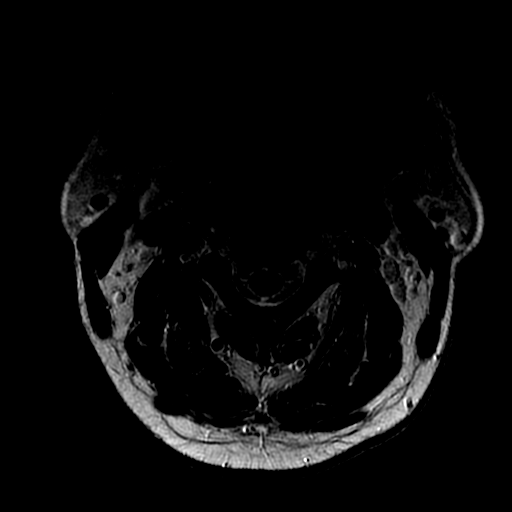
[im 31/31]
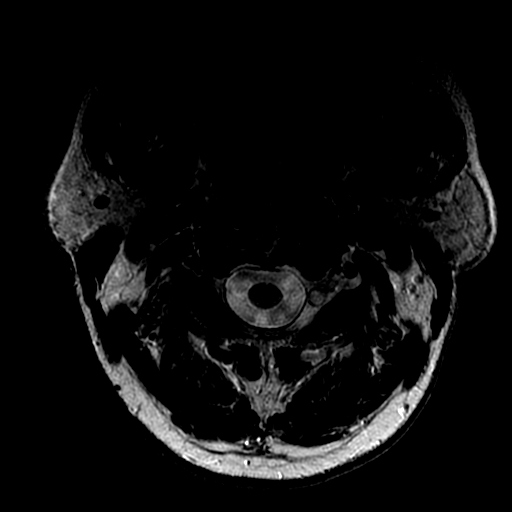

[Series 6: T1 · axial · non-contrast · 3.0mm · 0.35mm/px · z∈[-17,+63]mm · 3 of 31 slices shown (2 of 2)]
[im 4/31]
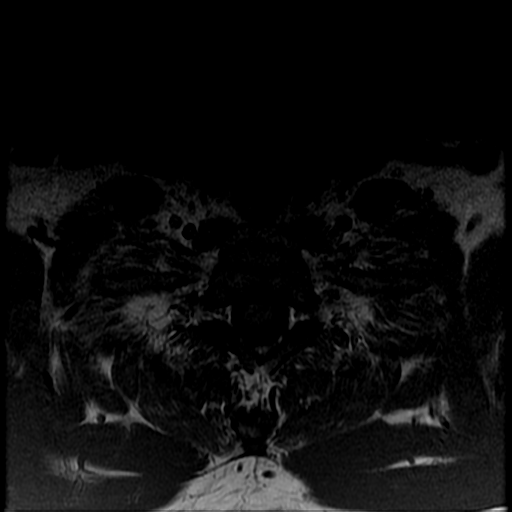
[im 16/31]
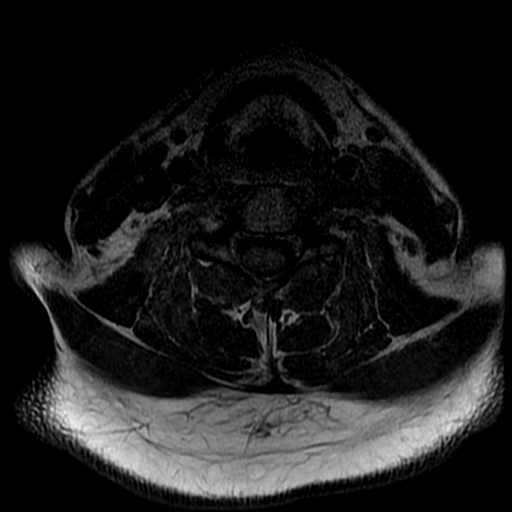
[im 27/31]
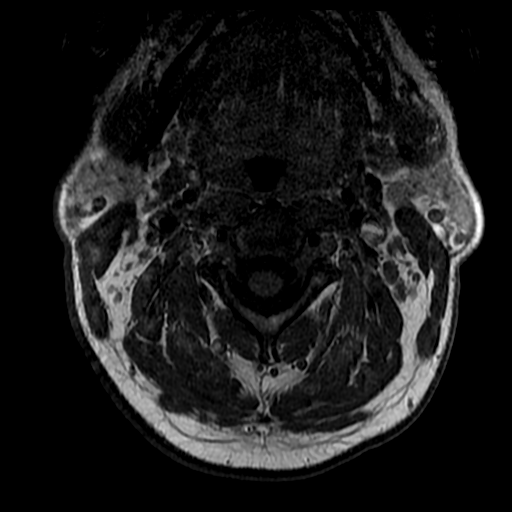

[Series 8: T2 post-contrast · sagittal · 3.0mm · 0.41mm/px · 3 of 12 slices shown]
[im 1/12]
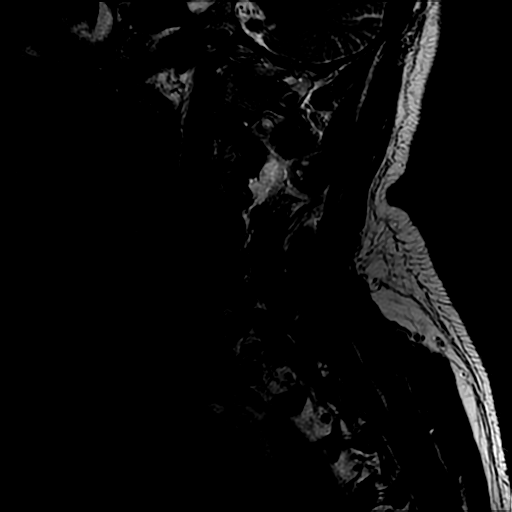
[im 6/12]
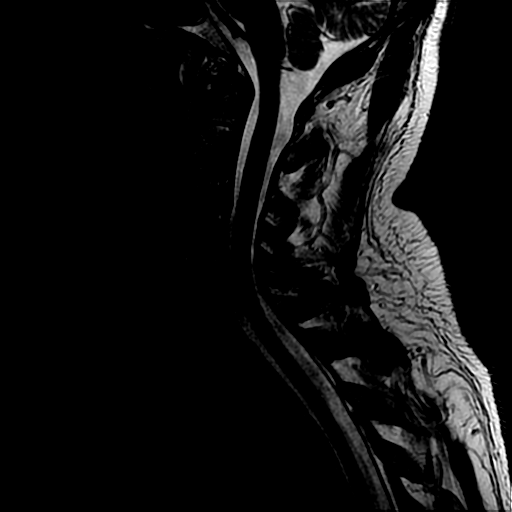
[im 12/12]
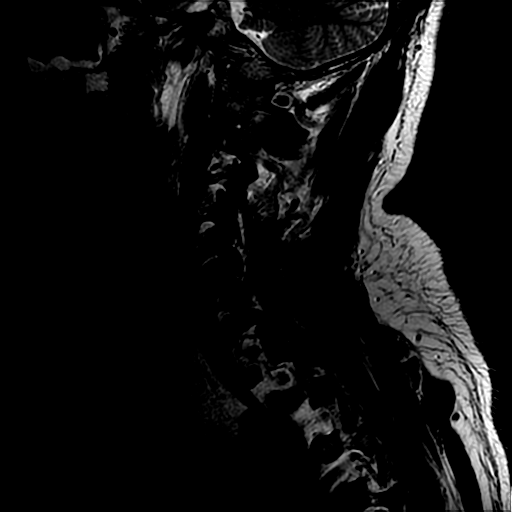

[19 of 48 positions shown; findings below may reference images not displayed]

FINDINGS: Patient moved during exam.

Cervical medullary junction unremarkable.

Maxillary sinus mucosal thickening.

No focal cord signal abnormality or enhancement.

C2-3: Minimal right uncinate hypertrophy with minimal right
foraminal narrowing.

C3-4:  Minimal bulge.  Minimal narrowing ventral thecal sac.

C4-5: Mild bulge. Mild narrowing ventral thecal sac. Minimal
foraminal narrowing greater on the right.

C5-6: Shallow left paracentral protrusion. Narrowing left ventral
thecal sac. Minimal foraminal narrowing greater on the left.

C6-7:  Negative.

C7-T1:  Negative.
IMPRESSION: Patient moved during exam.

C5-6 shallow left paracentral protrusion. Narrowing left ventral
thecal sac. Minimal foraminal narrowing greater on the left.

C4-5 mild bulge. Mild narrowing ventral thecal sac. Minimal
foraminal narrowing greater on the right.

C3-4 minimal bulge with minimal narrowing ventral thecal sac.

C2-3 minimal right foraminal narrowing.

## 2017-12-26 ENCOUNTER — Encounter (HOSPITAL_COMMUNITY): Payer: Self-pay | Admitting: Nurse Practitioner

## 2017-12-26 DIAGNOSIS — S0083XA Contusion of other part of head, initial encounter: Secondary | ICD-10-CM | POA: Insufficient documentation

## 2017-12-26 DIAGNOSIS — Z79899 Other long term (current) drug therapy: Secondary | ICD-10-CM | POA: Insufficient documentation

## 2017-12-26 DIAGNOSIS — Y999 Unspecified external cause status: Secondary | ICD-10-CM | POA: Insufficient documentation

## 2017-12-26 DIAGNOSIS — Z23 Encounter for immunization: Secondary | ICD-10-CM | POA: Diagnosis not present

## 2017-12-26 DIAGNOSIS — Y929 Unspecified place or not applicable: Secondary | ICD-10-CM | POA: Insufficient documentation

## 2017-12-26 DIAGNOSIS — S99921A Unspecified injury of right foot, initial encounter: Secondary | ICD-10-CM | POA: Diagnosis present

## 2017-12-26 DIAGNOSIS — S93401A Sprain of unspecified ligament of right ankle, initial encounter: Secondary | ICD-10-CM | POA: Insufficient documentation

## 2017-12-26 DIAGNOSIS — Y939 Activity, unspecified: Secondary | ICD-10-CM | POA: Insufficient documentation

## 2017-12-26 DIAGNOSIS — X501XXA Overexertion from prolonged static or awkward postures, initial encounter: Secondary | ICD-10-CM | POA: Insufficient documentation

## 2017-12-26 NOTE — ED Triage Notes (Signed)
Pt presents with a right ankle swelling, deformity and injury that he reports a fall by missing a stair case step.

## 2017-12-27 ENCOUNTER — Emergency Department (HOSPITAL_COMMUNITY)

## 2017-12-27 ENCOUNTER — Emergency Department (HOSPITAL_COMMUNITY)
Admission: EM | Admit: 2017-12-27 | Discharge: 2017-12-27 | Disposition: A | Discharge status: Home / Self Care | Attending: Emergency Medicine | Admitting: Emergency Medicine

## 2017-12-27 DIAGNOSIS — W108XXA Fall (on) (from) other stairs and steps, initial encounter: Secondary | ICD-10-CM

## 2017-12-27 DIAGNOSIS — S0083XA Contusion of other part of head, initial encounter: Secondary | ICD-10-CM

## 2017-12-27 DIAGNOSIS — S0081XA Abrasion of other part of head, initial encounter: Secondary | ICD-10-CM

## 2017-12-27 DIAGNOSIS — S93401A Sprain of unspecified ligament of right ankle, initial encounter: Secondary | ICD-10-CM

## 2017-12-27 MED ORDER — TETANUS-DIPHTH-ACELL PERTUSSIS 5-2.5-18.5 LF-MCG/0.5 IM SUSP
0.5000 mL | Freq: Once | INTRAMUSCULAR | Status: AC
  Administered 2017-12-27: 0.5 mL via INTRAMUSCULAR
  Filled 2017-12-27: qty 0.5

## 2017-12-27 MED ORDER — NAPROXEN 375 MG PO TABS: ORAL_TABLET | ORAL | 0 refills | Status: DC

## 2017-12-27 MED ORDER — NAPROXEN 500 MG PO TABS
500.0000 mg | ORAL_TABLET | Freq: Once | ORAL | Status: AC
  Administered 2017-12-27: 500 mg via ORAL
  Filled 2017-12-27: qty 1

## 2017-12-27 NOTE — ED Provider Notes (Signed)
[facility]-EMERGENCY DEPT Provider Note: Gabriel DellJ. Lane Santo Zahradnik, MD, FACEP  CSN: [id number] MRN: [id number] ARRIVAL: 12/26/17 at 2230 ROOM: WA08/WA08   CHIEF COMPLAINT  Ankle Injury   HISTORY OF PRESENT ILLNESS  12/27/17 4:07 AM [place] Gabriel Vasquez is a 52 y.o. male who tripped on a step just prior to arrival.  When he fell he twisted his right ankle.  He is having pain and swelling in the right ankle.  He rates his pain as a 7 out of 10, worse with attempted weightbearing.  He also hit his right forehead and has a small superficial laceration.  He did not lose consciousness.  He does have a headache.  He denies neck or back pain.   Past Medical History:  Diagnosis Date  . Cervical radiculopathy at C5   . Chest pain 11/05/2015  . Depression   . Hemorrhoids   . Mixed anxiety and depressive disorder   . Precordial chest pain 11/05/2015    Past Surgical History:  Procedure Laterality Date  . CARDIAC CATHETERIZATION N/A 12/24/2015   Procedure: Left Heart Cath and Coronary Angiography;  Surgeon: Jake BatheMark C Skains, MD;  [facility];  Service: Cardiovascular;  Laterality: N/A;  . VASECTOMY      Family History  Problem Relation Age of Onset  . Hypertension Mother   . Angina Maternal Grandfather     Social History   Tobacco Use  . Smoking status: Never Smoker  . Smokeless tobacco: Never Used  Substance Use Topics  . Alcohol use: Yes    Alcohol/week: 1.2 oz    Types: 2 Cans of beer per week  . Drug use: No    Prior to Admission medications   Medication Sig Start Date End Date Taking? Authorizing Provider  buPROPion (WELLBUTRIN XL) 300 MG 24 hr tablet Take 300 mg by mouth daily. 12/19/17  Yes [provider]  FLUoxetine (PROZAC) 20 MG capsule Take 20 mg by mouth daily.  10/01/15  Yes [provider]  naproxen sodium (ALEVE) 220 MG tablet Take 440 mg by mouth daily as needed (pain).   Yes [provider]  predniSONE (STERAPRED UNI-PAK 21 TAB) 10 MG (21) TBPK  tablet Take 10-50 mg by mouth as directed. 12/21/17  Yes [provider]    Allergies Patient has no known allergies.   REVIEW OF SYSTEMS  Negative except as noted here or in the History of Present Illness.   PHYSICAL EXAMINATION  Initial Vital Signs Blood pressure 127/77, pulse 95, temperature 98.9 F (37.2 C), temperature source Oral, resp. rate 18, SpO2 98 %.  Examination General: Well-developed, well-nourished male in no acute distress; appearance consistent with age of record HENT: normocephalic; small hematoma and abrasion to right forehead; no hemotympanum Eyes: pupils equal, round and reactive to light; extraocular muscles intact Neck: supple; nontender Heart: regular rate and rhythm Lungs: clear to auscultation bilaterally Chest: Nontender Abdomen: soft; nondistended; nontender; bowel sounds present Back: No spinal tenderness Extremities: No deformity; tenderness and swelling of right ankle without gross instability or ecchymosis, right foot distally neurovascularly intact; pulses normal Neurologic: Awake, alert and oriented; motor function intact in all extremities and symmetric; no facial droop Skin: Warm and dry Psychiatric: Normal mood and affect   RESULTS  Summary of this visit's results, reviewed by myself:   EKG Interpretation  Date/Time:    Ventricular Rate:    PR Interval:    QRS Duration:   QT Interval:    QTC Calculation:   R Axis:  Text Interpretation:        Laboratory Studies: No results found for this or any previous visit (from the past 24 hour(s)). Imaging Studies: Dg Ankle Complete Right  Result Date: 12/27/2017 CLINICAL DATA:  52 year old male with right ankle deformity. EXAM: RIGHT ANKLE - COMPLETE 3+ VIEW COMPARISON:  None. FINDINGS: There is no acute fracture or dislocation. The bones are well mineralized. No arthritic changes. The soft tissue swelling of the ankle primarily over the lateral malleolus. No radiopaque  foreign object. IMPRESSION: No acute fracture or dislocation. Electronically Signed   By: Elgie Collard M.D.   On: 12/27/2017 00:49    ED COURSE  Nursing notes and initial vitals signs, including pulse oximetry, reviewed.  Vitals:   12/26/17 2346 12/27/17 0417  BP: 127/77 128/78  Pulse: 95 96  Resp: 18 17  Temp: 98.9 F (37.2 C)   TempSrc: Oral   SpO2: 98% 100%   Examination consistent with a right ankle sprain.  We will place in an ASO and provide crutches.  He was advised he may be weightbearing as tolerated.  PROCEDURES    ED DIAGNOSES     ICD-10-CM   1. Fall on steps, initial encounter W10.8XXA   2. Sprain of right ankle, unspecified ligament, initial encounter S93.401A   3. Traumatic hematoma of forehead, initial encounter S00.83XA   4. Forehead abrasion, initial encounter S00.81XA        Blannie Shedlock, Jonny Ruiz, MD 12/27/17 [phone]

## 2017-12-27 NOTE — ED Notes (Signed)
Bed: WA08 Expected date:  Expected time:  Means of arrival:  Comments: 

## 2017-12-29 ENCOUNTER — Ambulatory Visit (INDEPENDENT_AMBULATORY_CARE_PROVIDER_SITE_OTHER): Admitting: Orthopaedic Surgery

## 2017-12-29 ENCOUNTER — Encounter (INDEPENDENT_AMBULATORY_CARE_PROVIDER_SITE_OTHER): Payer: Self-pay | Admitting: Orthopaedic Surgery

## 2017-12-29 VITALS — Ht 70.0 in | Wt 232.0 lb

## 2017-12-29 DIAGNOSIS — S93401A Sprain of unspecified ligament of right ankle, initial encounter: Secondary | ICD-10-CM | POA: Insufficient documentation

## 2017-12-29 MED ORDER — TRAMADOL HCL 50 MG PO TABS: ORAL_TABLET | ORAL | 0 refills | Status: DC

## 2017-12-29 NOTE — Progress Notes (Signed)
Office Visit Note   Patient: Gabriel Vasquez           Date of Birth: 01/19/1966           MRN: [id number] Visit Date: 12/29/2017              Requested by: Wilfrid Lund, PA [address] [place], [place] [address] PCP: Wilfrid Lund, [place]   Assessment & Plan: Visit Diagnoses:  1. Sprain of unspecified ligament of right ankle, initial encounter     Plan: Impression is right grade 2 ankle sprain.  At this point, we will place the patient in a Cam walker.  He will be weightbearing as tolerated however since he is unable to put weight on his leg secondary to pain at this time we are recommending him take a baby aspirin until he is able to fully weight-bear.  He will elevate as much as possible.  Given a prescription for tramadol as well.  We will keep him out of work for the next 2 weeks as he is unable to drive to [place] where his job is located.  He will follow-up with [place] in 2 weeks time for repeat evaluation.  At that point, we will start him in physical therapy.  He will call with concerns or questions in the meantime.  Follow-Up Instructions: Return in about 2 weeks (around 01/12/2018).   Orders:  No orders of the defined types were placed in this encounter.  No orders of the defined types were placed in this encounter.     Procedures: No procedures performed   Clinical Data: No additional findings.   Subjective: Chief Complaint  Patient presents with  . Right Ankle - Pain    HPI patient is a pleasant 52 year old gentleman who presents our clinic today for follow-up of a right ankle injury.  This occurred on 12/26/2017 when he was going down the steps on the deck when he missed a step and inverted his right ankle.  He was seen in the ED where x-rays were obtained.  These were negative for fracture.  He is placed in an ASO and given crutches.  Comes in today for follow-up.  His entire ankle is quite painful but he is having more pain to the medial aspect.  Describes  as a constant pain worse with weightbearing.  He has been taking over-the-counter anti-inflammatories with minimal relief of symptoms.  He has been elevating as much as possible.  No previous injury to the right ankle.  Review of Systems as detailed in HPI.  All others reviewed and are negative.   Objective: Vital Signs: Ht 5\' 10"  (1.778 m)   Wt 232 lb (105.2 kg)   BMI 33.29 kg/m   Physical Exam well-developed well-nourished gentleman in no acute distress.  Alert and oriented x3.  Ortho Exam examination of his right ankle reveals significant swelling.  Moderate ecchymosis throughout.  He is tender over the medial malleolus as well as the ATFL.  He has increased pain with inversion as well as plantar flexion and dorsiflexion.  Negative anterior drawer and talar tilt.  EHL and FHL intact.  No calf tenderness.  He is neurovascular intact distally.  Specialty Comments:  No specialty comments available.  Imaging: X-rays reviewed by me and canopy are negative for fracture or other acute findings.   PMFS History: Patient Active Problem List   Diagnosis Date Noted  . Sprain of unspecified ligament of right ankle, initial encounter 12/29/2017  .  Obstructive sleep apnea syndrome 07/01/2016  . Hypersomnia with sleep apnea 07/01/2016  . Headache above the eye region 07/01/2016  . Acute seasonal allergic rhinitis due to pollen 07/01/2016  . Abnormal stress test   . Cervical radiculopathy at C5   . Precordial chest pain 11/05/2015  . Depression 11/05/2015  . Chest pain 11/05/2015   Past Medical History:  Diagnosis Date  . Cervical radiculopathy at C5   . Chest pain 11/05/2015  . Depression   . Hemorrhoids   . Mixed anxiety and depressive disorder   . Precordial chest pain 11/05/2015    Family History  Problem Relation Age of Onset  . Hypertension Mother   . Angina Maternal Grandfather     Past Surgical History:  Procedure Laterality Date  . CARDIAC CATHETERIZATION N/A 12/24/2015    Procedure: Left Heart Cath and Coronary Angiography;  Surgeon: Jake BatheMark C Skains, MD;  [facility];  Service: Cardiovascular;  Laterality: N/A;  . VASECTOMY     Social History   Occupational History  . Not on file  Tobacco Use  . Smoking status: Never Smoker  . Smokeless tobacco: Never Used  Substance and Sexual Activity  . Alcohol use: Yes    Alcohol/week: 1.2 oz    Types: 2 Cans of beer per week  . Drug use: No  . Sexual activity: Yes

## 2018-01-04 ENCOUNTER — Encounter (INDEPENDENT_AMBULATORY_CARE_PROVIDER_SITE_OTHER): Payer: Self-pay | Admitting: Orthopaedic Surgery

## 2018-01-12 ENCOUNTER — Encounter (INDEPENDENT_AMBULATORY_CARE_PROVIDER_SITE_OTHER): Payer: Self-pay | Admitting: Orthopaedic Surgery

## 2018-01-12 ENCOUNTER — Ambulatory Visit (INDEPENDENT_AMBULATORY_CARE_PROVIDER_SITE_OTHER): Admitting: Orthopaedic Surgery

## 2018-01-12 ENCOUNTER — Other Ambulatory Visit (INDEPENDENT_AMBULATORY_CARE_PROVIDER_SITE_OTHER): Payer: Self-pay

## 2018-01-12 DIAGNOSIS — S93401A Sprain of unspecified ligament of right ankle, initial encounter: Secondary | ICD-10-CM | POA: Diagnosis not present

## 2018-01-12 NOTE — Progress Notes (Signed)
   Office Visit Note   Patient: Gabriel Vasquez           Date of Birth: 11/10/1965           MRN: [id number] Visit Date: 01/12/2018              Requested by: Wilfrid LundBecker, [place] G, [place] [address] [place], [place] [id number] PCP: Wilfrid LundBecker, [place] G, [place]   Assessment & Plan: Visit Diagnoses:  1. Sprain of unspecified ligament of right ankle, initial encounter     Plan: Impression is improving right ankle sprain.  At this point we will transition him to an ASO brace.  Referral to physical therapy.  Questions encouraged and answered.  Follow-up as needed.  Follow-Up Instructions: Return if symptoms worsen or fail to improve.   Orders:  No orders of the defined types were placed in this encounter.  No orders of the defined types were placed in this encounter.     Procedures: No procedures performed   Clinical Data: No additional findings.   Subjective: Chief Complaint  Patient presents with  . Right Ankle - Pain, Follow-up    Gabriel LericheMark comes in today for follow-up of his ankle sprain.  He is pain is significantly improved.  The pain is 3 out of 10.  He has some discomfort when standing.   Review of Systems   Objective: Vital Signs: There were no vitals taken for this visit.  Physical Exam  Ortho Exam Right ankle exam shows persistent moderate swelling.  There is more tenderness over the medial malleolus than on the lateral aspect of the ankle. Specialty Comments:  No specialty comments available.  Imaging: No results found.   PMFS History: Patient Active Problem List   Diagnosis Date Noted  . Sprain of unspecified ligament of right ankle, initial encounter 12/29/2017  . Obstructive sleep apnea syndrome 07/01/2016  . Hypersomnia with sleep apnea 07/01/2016  . Headache above the eye region 07/01/2016  . Acute seasonal allergic rhinitis due to pollen 07/01/2016  . Abnormal stress test   . Cervical radiculopathy at C5   . Precordial chest pain 11/05/2015  .  Depression 11/05/2015  . Chest pain 11/05/2015   Past Medical History:  Diagnosis Date  . Cervical radiculopathy at C5   . Chest pain 11/05/2015  . Depression   . Hemorrhoids   . Mixed anxiety and depressive disorder   . Precordial chest pain 11/05/2015    Family History  Problem Relation Age of Onset  . Hypertension Mother   . Angina Maternal Grandfather     Past Surgical History:  Procedure Laterality Date  . CARDIAC CATHETERIZATION N/A 12/24/2015   Procedure: Left Heart Cath and Coronary Angiography;  Surgeon: Jake BatheMark C Skains, MD;  [facility];  Service: Cardiovascular;  Laterality: N/A;  . VASECTOMY     Social History   Occupational History  . Not on file  Tobacco Use  . Smoking status: Never Smoker  . Smokeless tobacco: Never Used  Substance and Sexual Activity  . Alcohol use: Yes    Alcohol/week: 1.2 oz    Types: 2 Cans of beer per week  . Drug use: No  . Sexual activity: Yes

## 2018-02-02 ENCOUNTER — Telehealth (INDEPENDENT_AMBULATORY_CARE_PROVIDER_SITE_OTHER): Payer: Self-pay

## 2018-02-02 NOTE — Telephone Encounter (Signed)
FAXED TO [phone] INCLUDED DEMO SHEET.

## 2018-02-02 NOTE — Telephone Encounter (Signed)
Gabriel Vasquez with [facility] called stating that patient has an appt.today at 4pm and needs a PT referral faxed to [phone].  Cb# is [phone].  Please advise.  Thank you.

## 2018-02-08 ENCOUNTER — Telehealth: Payer: Self-pay | Admitting: Neurology

## 2018-02-08 NOTE — Telephone Encounter (Signed)
Patient says he never received results from his sleep study in 2017 and would like to discuss. Last message in 2017 says he was contacted but he says he does not remember.Marland Kitchen

## 2018-02-08 NOTE — Telephone Encounter (Signed)
Called the pt back to review the sleep study with him patient states he never heard from anyone after getting his report called to him about getting started. I asked if he had reached out to [place] since 2017, the patient states that he has not, "life got busy". I was able to schedule him an apt because since it has been over a year the pt would have to start the whole process because he never started any treatment. Informed the pt of this and he verbalized understanding. Pt will check in at 3 pm for a 3:30 apt tomorrow.

## 2018-02-09 ENCOUNTER — Ambulatory Visit (INDEPENDENT_AMBULATORY_CARE_PROVIDER_SITE_OTHER): Admitting: Neurology

## 2018-02-09 ENCOUNTER — Encounter: Payer: Self-pay | Admitting: Neurology

## 2018-02-09 ENCOUNTER — Telehealth: Payer: Self-pay | Admitting: Neurology

## 2018-02-09 VITALS — BP 122/83 | HR 73 | Ht 70.0 in | Wt 235.0 lb

## 2018-02-09 DIAGNOSIS — R0683 Snoring: Secondary | ICD-10-CM

## 2018-02-09 DIAGNOSIS — G4733 Obstructive sleep apnea (adult) (pediatric): Secondary | ICD-10-CM

## 2018-02-09 NOTE — Progress Notes (Signed)
SLEEP MED[facility]INE CLINIC   Provider:  Melvyn Novas, M D  Referring Provider: Lindaann Pascal, PA  Primary Care Physician:   Chief Complaint  Patient presents with  . Follow-up    pt alone, rm 10. pt had a sleep study in 2017 and was suppose to start a CPAP. Somewhere the ball was dropped and he never started the machine, he states life got busy and now he is ready to follow up and start again. informed him that since its been over he will have to start the process over. pt understands. complains still of being tired and snoring at night.    HPI: I had the pleasure of meeting today with Gabriel Vasquez on 09 Feb 2018 this is a revisit after his November 2017 sleep study.  A baseline polysomnography was performed and revealed a moderate sleep apnea at 20.9 events per hour, during REM sleep exacerbated to 58.5/h, I suggested that he should return for a CPAP titration study.  This was followed by a CPAP titration study on 24 September 2016, during which the patient was titrated to a pressure of 11 cmH2O with a reduction in AHI to 1.3.  He states that in order for CPAP was never issued.  The settings seem to have been optimal as his minimum oxygenation rose to 94% and his sleep efficiency was around 80% he slept about 94 minutes at the last name to pressure.      Gabriel Vasquez is a 52 y.o. male , seen here as a referral from [place],  Gabriel Vasquez reports that he was evaluated for sleep apnea and diagnosed with sleep apnea around age 83, and his CPAP was prescribed to him. Initially he was a compliant user and over the years he felt that the CPAP has become more of a burden. The machine is no longer found, he has moved and he is not even sure where he stores it at this time. However it would have not been used for over 5 years. Gabriel Vasquez was tested and treated in [place]. He also reported sinus and rhinitis problems but these have not culminated to epistaxis. Sometimes he has a moderate degree headache  in the frontal sinus region, sometimes a sore throat, but it is  his wife, who is  concerned about his very loud, thunderous snoring, which keeps her from sleeping and arrange for this visit.   Sleep habits are as follows: Gabriel Vasquez share a bedroom but it has been hard for Mrs. Vasquez to find sleep. He usually can go to sleep at any time and is rather promptly asleep and he reports sleeping so deep that it knocked on the door or a light nudge will not wake him. He will go to bed between 9:30 and 10:00, will be finding himself asleep promptly. He will start of on his side but usually finds himself at some time during the night on his back. He will sleep on one pillow. The bedroom is cool and quiet and dark. He does not have nocturia and can usually sleeps through the night until the morning hours. He will arise by 5:30 or 6 AM, used to be spontaneously arousing but now needs an alarm. He will even hits the snooze button  multiple times- craving a little extra sleep. He feels neither fully refreshed nor restored in the morning. He does not feel that he is necessarily suffering from a dry mouth but he recently had more sinus headaches ( these  are seasonal, about 8 month per year). He wakes up with headaches, and finds himself a mouth breather.    Sleep medical history and family sleep history:  No family member with OSA known to him.  Gabriel Vasquez was a sleep walker during his childhood years, he may have had some sleep terrors as well. He was also a bedwetter. He would dream that he was actually in the bathroom and wake up to find his bed soiled.   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 He is married, and follow 3 children. 16 year old daughter has mental disability, his mother is living with them , too. His 80 year old son has graduated .   Lifelong non- smoker,  Beer drinker , 4-6 a week. Caffeine -  coffee - 2-3 cups a day. Also he has restricted his caffeine use normally to the morning hours, he reports that he can drink coffee at night and has to go to bed.  Review of Systems: Out of a complete 14 system review, the patient complains of only the following symptoms, and all other reviewed systems are negative. Snoring, nasal congestion, frontal headaches.   Epworth score  15 , Fatigue severity score 37  , depression score n/a  ( under treatment)   How likely are you to doze in the following situations: 0 = not likely, 1 = slight chance, 2 = moderate chance, 3 = high chance  Sitting and Reading? 2 Watching Television? 3 Sitting inactive in a public place (theater or meeting)?1 As a passenger in a car for an hour without a break?3 Lying down in the afternoon when circumstances permit?3 Sitting and talking to someone?0 Sitting quietly after lunch without alcohol?1 In a car, while stopped for a few minutes in traffic?1   Total = 15     Social History   Socioeconomic History  . Marital status: Married    Spouse name: Not on file  . Number of children: Not on file  . Years of education: Not on file  . Highest education level: Not on file  Occupational History  . Not on file  Social Needs  . Financial resource strain: Not on file  . Food insecurity:    Worry: Not on file    Inability: Not on file  . Transportation needs:    Medical: Not on file    Non-medical: Not on file  Tobacco Use  . Smoking status: Never Smoker  . Smokeless tobacco: Never Used  Substance and Sexual Activity  . Alcohol use: Yes    Alcohol/week: 1.2 oz    Types: 2 Cans of beer per week  . Drug use: No  . Sexual activity: Yes  Lifestyle  . Physical activity:    Days per week: Not on file    Minutes per session: Not on file  . Stress: Not on file  Relationships  . Social connections:    Talks on phone: Not on file    Gets together: Not on file    Attends religious service: Not on file     Active member of club or organization: Not on file    Attends meetings of clubs or organizations: Not on file    Relationship status: Not on file  . Intimate partner violence:    Fear of current or ex partner: Not on file    Emotionally abused: Not on file  Physically abused: Not on file    Forced sexual activity: Not on file  Other Topics Concern  . Not on file  Social History Narrative  . Not on file    Family History  Problem Relation Age of Onset  . Hypertension Mother   . Angina Maternal Grandfather     Past Medical History:  Diagnosis Date  . Cervical radiculopathy at C5   . Chest pain 11/05/2015  . Depression   . Hemorrhoids   . Mixed anxiety and depressive disorder   . Precordial chest pain 11/05/2015    Past Surgical History:  Procedure Laterality Date  . CARDIAC CATHETERIZATION N/A 12/24/2015   Procedure: Left Heart Cath and Coronary Angiography;  Surgeon: Jake Bathe, MD;  Location: [facility] INVASIVE CV LAB;  Service: Cardiovascular;  Laterality: N/A;  . VASECTOMY      Current Outpatient Medications  Medication Sig Dispense Refill  . buPROPion (WELLBUTRIN XL) 300 MG 24 hr tablet Take 300 mg by mouth daily.  1  . FLUoxetine (PROZAC) 20 MG capsule Take 20 mg by mouth daily.   1   No current facility-administered medications for this visit.     Allergies as of 02/09/2018  . (No Known Allergies)    Vitals: BP 122/83   Pulse 73   Ht  (1.778 m)   Wt 235 lb (106.6 kg)   BMI 33.72 kg/m  Last Weight:  Wt Readings from Last 1 Encounters:  02/09/18 235 lb (106.6 kg)   ZOX:WRUE mass index is 33.72 kg/m.     Last Height:   Ht Readings from Last 1 Encounters:  02/09/18  (1.778 m)    Physical exam:  General: The patient is awake, alert and appears not in acute distress. The patient is well groomed. Head: Normocephalic, atraumatic. Neck is supple. Mallampati 3,  neck circumference:17.5 . Nasal airflow congested , TMJ click and pop on the left side  evident . Retrognathia is seen.  Cardiovascular:  Regular rate and rhythm without  murmurs or carotid bruit, and without distended neck veins. Respiratory: Lungs are clear to auscultation. Skin:  Without evidence of edema, or rash Trunk: BMI is elevated .    Neurologic exam : The patient is awake and alert, oriented to place and time.   Attention span & concentration ability appears normal.  Speech is fluent,  without dysarthria, dysphonia or aphasia.  Mood and affect are appropriate.  Cranial nerves: Pupils are equal and briskly reactive to light. Funduscopic exam without evidence of pallor or edema.  Extraocular movements  in vertical and horizontal planes intact and without nystagmus. Visual fields by finger perimetry are intact. Hearing to finger rub intact. Facial sensation intact to fine touch.Facial motor strength is symmetric and tongue and uvula move midline. Shoulder shrug was symmetrical.   Motor exam:  Normal tone, muscle bulk and symmetric strength in all extremities. Sensory:  Fine touch, pinprick and vibration  were tested in all extremities. Proprioception tested in the upper extremities was normal. Coordination: Rapid alternating movements in the fingers/hands was normal. Finger-to-nose maneuver  normal without evidence of ataxia, dysmetria or tremor. Gait and station: Patient walks without assistive device and is able unassisted to climb up to the exam table. Strength within normal limits.  Stance is stable and normal.  Tandem gait is unfragmented.  Deep tendon reflexes: in the  upper and lower extremities are symmetric and intact. Babinski maneuver response is downgoing.  The patient was advised of the nature of  the diagnosed sleep disorder , the treatment options and risks for general a health and wellness arising from not treating the condition.  I spent more than 40 minutes of face to face time with the patient. Greater than 50% of time was spent in counseling and  coordination of care. We have discussed the diagnosis and differential and I answered the patient's questions.     Assessment:  After physical and neurologic examination, review of laboratory studies,  Personal review of imaging studies, reports of other /same  Imaging studies ,  Results of polysomnography/ neurophysiology testing and pre-existing records as far as provided in visit., my assessment is   1) Gabriel Vasquez is a gentleman with a known history of obstructive sleep apnea, diagnosed about 12 years ago. He was again tested first and a based on color sonography on 08 August 2016 which confirmed the diagnosis oxygen nadir of 78% total desaturation time was short obstructive sleep apnea with strong REM sleep accentuation as his REM AHI was 58.5 but his overall AHI was mild to moderate at 20.9.  He had sinus rhythm no cardiac abnormalities and only moderate snoring.  He did have some periodic limb movements which seem to be related to apnea.  I have asked after interpreting his CPAP titration from 24 September 2016 that the patient be referred to a medical equipment company to receive a new CPAP.  I recommend an auto titration capable device to set at 11 cmH2O pressure with heated humidity and she did well with the ResMed air fit nasal pillow and large size.  I will send the order to [facility] now.  I excused for the severe delay - his order during the Christmas Holidays was not followed up/ was not processed.   He will see Np or me in follow up in 3-4 month.       Porfirio Mylar Karyl Sharrar MD  02/09/2018   CC: Wilfrid Lund, [place] [address] [place], [place] [address]

## 2018-05-26 ENCOUNTER — Encounter: Payer: Self-pay | Admitting: Nurse Practitioner

## 2018-05-27 NOTE — Progress Notes (Signed)
[facility]  PATIENT: Gabriel Vasquez DOB: 07-Mar-1966   REASON FOR VISIT: Follow-up for obstructive sleep apnea with initial CPAP HISTORY FROM: Patient   HISTORY OF PRESENT ILLNESS:UPDATE 8/30/2019CM Gabriel Vasquez, 52 year old male returns for follow-up for initial CPAP.  He claims he is getting used to it and has had no problems.  Apparently he was on CPAP  years ago and it did not use his CPAP for a period of years.  He was originally diagnosed when he was 52 years old.  Compliance data dated 04/27/2018-05/26/2018 shows compliance greater than 4 hours at 93%.  Average usage 5 hours 19 minutes set pressure 11 cm.  AHI 1.3.  No obstructive or central apneas.  He returns for reevaluation 5/14/19CD I had the pleasure of meeting today with Gabriel Vasquez on 09 Feb 2018 this is a revisit after his November 2017 sleep study.  A baseline polysomnography was performed and revealed a moderate sleep apnea at 20.9 events per hour, during REM sleep exacerbated to 58.5/h, I suggested that he should return for a CPAP titration study.  This was followed by a CPAP titration study on 24 September 2016, during which the patient was titrated to a pressure of 11 cmH2O with a reduction in AHI to 1.3.  He states that in order for CPAP was never issued.  The settings seem to have been optimal as his minimum oxygenation rose to 94% and his sleep efficiency was around 80% he slept about 94 minutes at the last name to pressure.      Gabriel BrayMark Vasquez is a 52 y.o. male , seen here as a referral from [place] [facility],  Gabriel Vasquez reports that he was evaluated for sleep apnea and diagnosed with sleep apnea around age 52, and his CPAP was prescribed to him. Initially he was a compliant user and over the years he felt that the CPAP has become more of a burden. The machine is no longer found, he has moved and he is not even sure where he stores it at this time. However it would have not been used for over 5 years. Mr.  Ernestene Vasquez was tested and treated in [place]. He also reported sinus and rhinitis problems but these have not culminated to epistaxis. Sometimes he has a moderate degree headache in the frontal sinus region, sometimes a sore throat, but it is  his wife, who is  concerned about his very loud, thunderous snoring, which keeps her from sleeping and arrange for this visit.   Sleep habits are as follows: Mr. [name] share a bedroom but it has been hard for Mrs. Vasquez to find sleep. He usually can go to sleep at any time and is rather promptly asleep and he reports sleeping so deep that it knocked on the door or a light nudge will not wake him. He will go to bed between 9:30 and 10:00, will be finding himself asleep promptly. He will start of on his side but usually finds himself at some time during the night on his back. He will sleep on one pillow. The bedroom is cool and quiet and dark. He does not have nocturia and can usually sleeps through the night until the morning hours. He will arise by 5:30 or 6 AM, used to be spontaneously arousing but now needs an alarm. He will even hits the snooze button  multiple times- craving a little extra sleep. He feels neither fully refreshed nor restored in the morning. He does not feel that  he is necessarily suffering from a dry mouth but he recently had more sinus headaches ( these are seasonal, about 8 month per year). He wakes up with headaches, and finds himself a mouth breather.     REVIEW OF SYSTEMS: Full 14 system review of systems performed and notable only for those listed, all others are neg:  Constitutional: neg  Cardiovascular: neg Ear/Nose/Throat: neg  Skin: neg Eyes: neg Respiratory: neg Gastroitestinal: neg  Hematology/Lymphatic: neg  Endocrine: neg Musculoskeletal:neg Allergy/Immunology: neg Neurological: neg Psychiatric: neg Sleep : Obstructive sleep apnea with CPAP  ALLERGIES: No Known Allergies  HOME MEDICATIONS: Outpatient  Medications Prior to Visit  Medication Sig Dispense Refill  . buPROPion (WELLBUTRIN XL) 300 MG 24 hr tablet Take 300 mg by mouth daily.  1  . FLUoxetine (PROZAC) 20 MG capsule Take 20 mg by mouth daily.   1   No facility-administered medications prior to visit.     PAST MEDICAL HISTORY: Past Medical History:  Diagnosis Date  . Cervical radiculopathy at C5   . Chest pain 11/05/2015  . Depression   . Hemorrhoids   . Mixed anxiety and depressive disorder   . OSA on CPAP 2019  . Precordial chest pain 11/05/2015    PAST SURGICAL HISTORY: Past Surgical History:  Procedure Laterality Date  . CARDIAC CATHETERIZATION N/A 12/24/2015   Procedure: Left Heart Cath and Coronary Angiography;  Surgeon: Jake Bathe, MD;  Location: [facility] INVASIVE CV LAB;  Service: Cardiovascular;  Laterality: N/A;  . VASECTOMY      FAMILY HISTORY: Family History  Problem Relation Age of Onset  . Hypertension Mother   . Angina Maternal Grandfather     SOCIAL HISTORY: Social History   Socioeconomic History  . Marital status: Married    Spouse name: Not on file  . Number of children: Not on file  . Years of education: Not on file  . Highest education level: Not on file  Occupational History  . Not on file  Social Needs  . Financial resource strain: Not on file  . Food insecurity:    Worry: Not on file    Inability: Not on file  . Transportation needs:    Medical: Not on file    Non-medical: Not on file  Tobacco Use  . Smoking status: Never Smoker  . Smokeless tobacco: Never Used  Substance and Sexual Activity  . Alcohol use: Yes    Alcohol/week: 2.0 standard drinks    Types: 2 Cans of beer per week  . Drug use: No  . Sexual activity: Yes  Lifestyle  . Physical activity:    Days per week: Not on file    Minutes per session: Not on file  . Stress: Not on file  Relationships  . Social connections:    Talks on phone: Not on file    Gets together: Not on file    Attends religious service: Not  on file    Active member of club or organization: Not on file    Attends meetings of clubs or organizations: Not on file    Relationship status: Not on file  . Intimate partner violence:    Fear of current or ex partner: Not on file    Emotionally abused: Not on file    Physically abused: Not on file    Forced sexual activity: Not on file  Other Topics Concern  . Not on file  Social History Narrative  . Not on file     PHYSICAL  EXAM  Vitals:   05/28/18 0838  BP: 119/73  Pulse: 62  Weight: 221 lb (100.2 kg)  Height: 5\' 10"  (1.778 m)   Body mass index is 31.71 kg/m.  Generalized: Well developed, in no acute distress  Head: normocephalic and atraumatic,. Oropharynx benign mallopatti3 Neck: Supple, circumference 18 Lungs clear Musculoskeletal: No deformity  Skin no rash or edema Neurological examination   Mentation: Alert oriented to time, place, history taking. Attention span and concentration appropriate. Recent and remote memory intact.  Follows all commands speech and language fluent.   Cranial nerve II-XII: Pupils were equal round reactive to light extraocular movements were full, visual field were full on confrontational test. Facial sensation and strength were normal. hearing was intact to finger rubbing bilaterally. Uvula tongue midline. head turning and shoulder shrug were normal and symmetric.Tongue protrusion into cheek strength was normal. Motor: normal bulk and tone, full strength in the BUE, BLE,  Sensory: normal and symmetric to light touch,   Coordination: finger-nose-finger, heel-to-shin bilaterally, no dysmetria Gait and Station: Rising up from seated position without assistance, normal stance,  moderate stride, good arm swing, smooth turning, able to perform tiptoe, and heel walking without difficulty. Tandem gait is steady  DIAGNOSTIC DATA (LABS, IMAGING, TESTING) - I reviewed patient records, labs, notes, testing and imaging myself where available.  Lab  Results  Component Value Date   WBC 6.7 12/20/2015   HGB 14.6 12/20/2015   HCT 42.3 12/20/2015   MCV 84.6 12/20/2015   PLT 205 12/20/2015      Component Value Date/Time   NA 137 12/20/2015 1016   K 4.5 12/20/2015 1016   CL 104 12/20/2015 1016   CO2 27 12/20/2015 1016   GLUCOSE 91 12/20/2015 1016   BUN 19 12/20/2015 1016   CREATININE 1.05 12/20/2015 1016   CALCIUM 9.0 12/20/2015 1016   PROT 6.8 11/06/2015 0555   ALBUMIN 4.0 11/06/2015 0555   AST 17 11/06/2015 0555   ALT 27 11/06/2015 0555   ALKPHOS 63 11/06/2015 0555   BILITOT 0.6 11/06/2015 0555   GFRNONAA >60 11/06/2015 0555   GFRAA >60 11/06/2015 0555   Lab Results  Component Value Date   CHOL 194 12/05/2015   HDL 41 12/05/2015   LDLCALC 135 (H) 12/05/2015   TRIG 91 12/05/2015   CHOLHDL 4.7 12/05/2015    ASSESSMENT AND PLAN  52 y.o. year old male  has a past medical history of obstructive sleep apnea here for initial CPAP    CPAP compliance 93% reviewed data with patient Continue same settings We will order supplies Follow-up yearly and as needed Nilda Riggs, [facility], [facility], APRN  [facility] [address], [address] [place], [place] [address] [phone]

## 2018-05-28 ENCOUNTER — Ambulatory Visit (INDEPENDENT_AMBULATORY_CARE_PROVIDER_SITE_OTHER): Admitting: Nurse Practitioner

## 2018-05-28 ENCOUNTER — Encounter: Payer: Self-pay | Admitting: Nurse Practitioner

## 2018-05-28 DIAGNOSIS — G4733 Obstructive sleep apnea (adult) (pediatric): Secondary | ICD-10-CM | POA: Diagnosis not present

## 2018-05-28 DIAGNOSIS — Z9989 Dependence on other enabling machines and devices: Secondary | ICD-10-CM | POA: Diagnosis not present

## 2018-05-28 NOTE — Progress Notes (Signed)
CPAP order for new supplies faxed to [facility].

## 2018-05-28 NOTE — Patient Instructions (Signed)
CPAP compliance 93% Continue same settings We will order supplies Follow-up yearly and as needed

## 2019-06-02 ENCOUNTER — Ambulatory Visit (INDEPENDENT_AMBULATORY_CARE_PROVIDER_SITE_OTHER): Admitting: Neurology

## 2019-06-02 ENCOUNTER — Encounter: Payer: Self-pay | Admitting: Neurology

## 2019-06-02 ENCOUNTER — Other Ambulatory Visit: Payer: Self-pay

## 2019-06-02 VITALS — BP 129/88 | HR 82 | Temp 97.8°F | Ht 70.5 in | Wt 248.0 lb

## 2019-06-02 DIAGNOSIS — G4733 Obstructive sleep apnea (adult) (pediatric): Secondary | ICD-10-CM

## 2019-06-02 DIAGNOSIS — G4710 Hypersomnia, unspecified: Secondary | ICD-10-CM

## 2019-06-02 DIAGNOSIS — G4730 Sleep apnea, unspecified: Secondary | ICD-10-CM | POA: Diagnosis not present

## 2019-06-02 DIAGNOSIS — Z9989 Dependence on other enabling machines and devices: Secondary | ICD-10-CM

## 2019-06-02 DIAGNOSIS — R072 Precordial pain: Secondary | ICD-10-CM | POA: Diagnosis not present

## 2019-06-02 NOTE — Progress Notes (Signed)
[facility]  SLEEP MEDICINE CLINIC  PATIENT: Gabriel Vasquez DOB: [birth year]   REASON FOR VISIT: Follow-up for obstructive sleep apnea with initial CPAP HISTORY FROM: Patient   RV 06-02-2019, I have the pleasure of meeting today with Mr. Gabriel Vasquez, I mean by 53 year old right-handed Caucasian gentleman who had been tested for obstructive sleep apnea and a sleep study performed on November 2017.  He had moderate sleep apnea at the time of 20.9 AHI per hour but strong REM sleep exacerbation he continues to use CPAP. His compliance has slept and sometimes he falls asleep before he has been ready to put it on.  He also travels all back to school but precautions.  Compliance has been 53% for the.  Until 05/24/2019, machine is set at 11 cmH2O with 1 cm EPR, his AHI is 2.1 which still speaks for an excellent resolution of the 95th percentile pressure is 9.8 cmH2O which also means that his current pressure setting covers his meals.  There are no central apneas emerging. Epworth Sleepiness Score 8/ 24 points.    HISTORY OF PRESENT ILLNESS:UPDATE 05/28/2018: CM Mr. Gabriel Vasquez, 53 year old male returns for follow-up for initial CPAP.  He claims he is getting used to it and has had no problems.  Apparently he was on CPAP  years ago and it did not use his CPAP for a period of years.  He was originally diagnosed when he was 53 years old.  Compliance data dated 04/27/2018-05/26/2018 shows compliance greater than 4 hours at 93%.  Average usage 5 hours 19 minutes set pressure 11 cm.  AHI 1.3.  No obstructive or central apneas.  He returns for reevaluation  02/09/18  CD I had the pleasure of meeting today with Mr. Gabriel Vasquez on 09 Feb 2018 this is a revisit after his November 2017 sleep study.  A baseline polysomnography was performed and revealed a moderate sleep apnea at 20.9 events per hour, during REM sleep exacerbated to 58.5/h, I suggested that he should return for a CPAP titration study.  This  was followed by a CPAP titration study on 24 September 2016, during which the patient was titrated to a pressure of 11 cmH2O with a reduction in AHI to 1.3.  He states that in order for CPAP was never issued.  The settings seem to have been optimal as his minimum oxygenation rose to 94% and his sleep efficiency was around 80% he slept about 94 minutes at the last name to pressure.      Gabriel Vasquez is a 53 y.o. male , seen here as a referral from PA [place] Long,  Gabriel Vasquez reports that he was evaluated for sleep apnea and diagnosed with sleep apnea around age 46, and his CPAP was prescribed to him. Initially he was a compliant user and over the years he felt that the CPAP has become more of a burden. The machine is no longer found, he has moved and he is not even sure where he stores it at this time. However it would have not been used for over 5 years. Gabriel Vasquez was tested and treated in [place]. He also reported sinus and rhinitis problems but these have not culminated to epistaxis. Sometimes he has a moderate degree headache in the frontal sinus region, sometimes a sore throat, but it is  his wife, who is  concerned about his very loud, thunderous snoring, which keeps her from sleeping and arrange for this visit.   Sleep habits are as follows: Mr. Mrs.  Vasquez share a bedroom but it has been hard for Gabriel Vasquez to find sleep. He usually can go to sleep at any time and is rather promptly asleep and he reports sleeping so deep that it knocked on the door or a light nudge will not wake him. He will go to bed between 9:30 and 10:00, will be finding himself asleep promptly. He will start of on his side but usually finds himself at some time during the night on his back. He will sleep on one pillow. The bedroom is cool and quiet and dark. He does not have nocturia and can usually sleeps through the night until the morning hours. He will arise by 5:30 or 6 AM, used to be spontaneously arousing  but now needs an alarm. He will even hits the snooze button  multiple times- craving a little extra sleep. He feels neither fully refreshed nor restored in the morning. He does not feel that he is necessarily suffering from a dry mouth but he recently had more sinus headaches ( these are seasonal, about 8 month per year). He wakes up with headaches, and finds himself a mouth breather.     REVIEW OF SYSTEMS: Full 14 system review of systems performed and notable only for those listed, all others are Sleep : Obstructive sleep apnea with CPAP. No loss of smell or tastte.  No changes in sleep wake cycle.   son was infected, asymptomic initially- girlfriend's room mate was infected.  he isolated outside the parental home and recovered.    ALLERGIES: No Known Allergies  HOME MEDICATIONS: Outpatient Medications Prior to Visit  Medication Sig Dispense Refill  . buPROPion (WELLBUTRIN XL) 300 MG 24 hr tablet Take 300 mg by mouth daily.  1  . FLUoxetine (PROZAC) 20 MG capsule Take 20 mg by mouth daily.   1   No facility-administered medications prior to visit.     PAST MEDICAL HISTORY: Past Medical History:  Diagnosis Date  . Cervical radiculopathy at C5   . Chest pain 11/05/2015  . Depression   . Hemorrhoids   . Mixed anxiety and depressive disorder   . OSA on CPAP 2019  . Precordial chest pain 11/05/2015    PAST SURGICAL HISTORY: Past Surgical History:  Procedure Laterality Date  . CARDIAC CATHETERIZATION N/A 12/24/2015   Procedure: Left Heart Cath and Coronary Angiography;  Surgeon: Jake BatheMark C Skains, MD;  [facility];  Service: Cardiovascular;  Laterality: N/A;  . VASECTOMY      FAMILY HISTORY: Family History  Problem Relation Age of Onset  . Hypertension Mother   . Angina Maternal Grandfather     SOCIAL HISTORY: Social History   Socioeconomic History  . Marital status: Married    Spouse name: Not on file  . Number of children: Not on file  . Years of  education: Not on file  . Highest education level: Not on file  Occupational History  . Not on file  Social Needs  . Financial resource strain: Not on file  . Food insecurity    Worry: Not on file    Inability: Not on file  . Transportation needs    Medical: Not on file    Non-medical: Not on file  Tobacco Use  . Smoking status: Never Smoker  . Smokeless tobacco: Never Used  Substance and Sexual Activity  . Alcohol use: Yes    Alcohol/week: 2.0 standard drinks    Types: 2 Cans of beer per week  .  Drug use: No  . Sexual activity: Yes  Lifestyle  . Physical activity    Days per week: Not on file    Minutes per session: Not on file  . Stress: Not on file  Relationships  . Social Herbalist on phone: Not on file    Gets together: Not on file    Attends religious service: Not on file    Active member of club or organization: Not on file    Attends meetings of clubs or organizations: Not on file    Relationship status: Not on file  . Intimate partner violence    Fear of current or ex partner: Not on file    Emotionally abused: Not on file    Physically abused: Not on file    Forced sexual activity: Not on file  Other Topics Concern  . Not on file  Social History Narrative  . Not on file  Not in the gym since covid, no regular exercise.      PHYSICAL EXAM  Vitals:   06/02/19 0919  BP: 129/88  Pulse: 82  Temp: 97.8 F (36.6 C)  Weight: 248 lb (112.5 kg)  Height: 5' 10.5" (1.791 m)   Body mass index is 35.08 kg/m.  Generalized: Well developed, in no acute distress  Head: normocephalic and atraumatic,. Oropharynx benign mallopatti3 Neck: Supple, circumference 18 inches  Lungs clear Musculoskeletal: No deformity  Skin no rash or edema Neurological examination   Mentation: Alert oriented to time, place, history taking. Attention span and concentration appropriate. Recent and remote memory intact.  Follows all commands speech and language fluent.    Cranial nerve II-XII: Pupils were equal round reactive to light extraocular movements were full, visual field were full on confrontational test. Facial sensation and strength were normal. hearing was intact to finger rubbing bilaterally. Uvula tongue midline. head turning and shoulder shrug were normal and symmetric.Tongue protrusion into cheek strength was normal. Motor: normal bulk and tone, full strength in the BUE, BLE,  Sensory: normal and symmetric to light touch,   Coordination: finger-nose-finger, heel-to-shin bilaterally, no dysmetria Gait and Station: Rising up from seated position without assistance, normal stance,  moderate stride, good arm swing, smooth turning, able to perform tiptoe, and heel walking without difficulty. Tandem gait is steady  DIAGNOSTIC DATA (LABS, IMAGING, TESTING) - I reviewed patient records, labs, notes, testing and imaging myself where available.   Labs through [facility]  53 y.o. year old male  has a past medical history of obstructive sleep apnea here for slipping compliance on CPAP , nasal pillow user. P10.  He is happy with nasal pillow, may switch to bella swift. [facility].   CPAP compliance 93% reviewed data with patient Continue same settings We will order supplies Follow-up yearly and as needed.   Larey Seat, MD    [facility] [address], [place], [place] [address] [phone]

## 2019-07-22 ENCOUNTER — Emergency Department (HOSPITAL_COMMUNITY)
Admission: EM | Admit: 2019-07-22 | Discharge: 2019-07-22 | Disposition: A | Discharge status: Home / Self Care | Attending: Emergency Medicine | Admitting: Emergency Medicine

## 2019-07-22 ENCOUNTER — Other Ambulatory Visit: Payer: Self-pay

## 2019-07-22 ENCOUNTER — Encounter (HOSPITAL_COMMUNITY): Payer: Self-pay | Admitting: Emergency Medicine

## 2019-07-22 DIAGNOSIS — Z79899 Other long term (current) drug therapy: Secondary | ICD-10-CM | POA: Diagnosis not present

## 2019-07-22 DIAGNOSIS — H1132 Conjunctival hemorrhage, left eye: Secondary | ICD-10-CM

## 2019-07-22 DIAGNOSIS — H5712 Ocular pain, left eye: Secondary | ICD-10-CM | POA: Diagnosis present

## 2019-07-22 LAB — CBC WITH DIFFERENTIAL/PLATELET
Abs Immature Granulocytes: 0.06 10*3/uL (ref 0.00–0.07)
Basophils Absolute: 0.1 10*3/uL (ref 0.0–0.1)
Basophils Relative: 1 %
Eosinophils Absolute: 0.1 10*3/uL (ref 0.0–0.5)
Eosinophils Relative: 1 %
HCT: 45.2 % (ref 39.0–52.0)
Hemoglobin: 16 g/dL (ref 13.0–17.0)
Immature Granulocytes: 1 %
Lymphocytes Relative: 25 %
Lymphs Abs: 1.8 10*3/uL (ref 0.7–4.0)
MCH: 30.8 pg (ref 26.0–34.0)
MCHC: 35.4 g/dL (ref 30.0–36.0)
MCV: 86.9 fL (ref 80.0–100.0)
Monocytes Absolute: 0.4 10*3/uL (ref 0.1–1.0)
Monocytes Relative: 6 %
Neutro Abs: 5 10*3/uL (ref 1.7–7.7)
Neutrophils Relative %: 66 %
Platelets: 214 10*3/uL (ref 150–400)
RBC: 5.2 MIL/uL (ref 4.22–5.81)
RDW: 13.4 % (ref 11.5–15.5)
WBC: 7.4 10*3/uL (ref 4.0–10.5)
nRBC: 0 % (ref 0.0–0.2)

## 2019-07-22 NOTE — ED Triage Notes (Signed)
Pt reports the inner portion of his L eye has been painful and bloodshot since Monday, states he was shelling peanuts around the time this started. Pt also endorses mild cough and headache for the past few days, denies any fevers, chills, n/v/d, or sick contacts, did a telehealth visit with his PCP who advised him to come to the ED. Denies any visual changes.

## 2019-07-22 NOTE — Discharge Instructions (Signed)
Your lab work today is unremarkable.  No indications of any bleeding abnormalities or platelet abnormalities. Recommend follow-up with your PCP as needed.  The bruise to your eye will heal over time.  Follow-up with ophthalmology for any worsening or concerning symptoms.

## 2019-07-22 NOTE — ED Provider Notes (Signed)
[facility] EMERGENCY DEPARTMENT Provider Note   CSN: [id number] Arrival date & time: 07/22/19  0740     History   Chief Complaint Chief Complaint  Patient presents with  . Eye Problem    HPI Gabriel Vasquez is a 53 y.o. male.     53 year old male presents with complaint of redness to his left eye medially.  Patient first noticed this area on Monday, reports some soreness with the eye, no visual disturbance, no trauma to the eye.  Patient states that he has sneezed a few times and to cough but nothing significant or out of the ordinary for him.  Patient denies any vomiting.  Patient is not on any anticoagulants, no history of easy bleeding or bruising.  Patient did a teledoc visit and was advised to come to the emergency room for possible viral infection concerns.  No other complaints or concerns.     Past Medical History:  Diagnosis Date  . Cervical radiculopathy at C5   . Chest pain 11/05/2015  . Depression   . Hemorrhoids   . Mixed anxiety and depressive disorder   . OSA on CPAP 2019  . Precordial chest pain 11/05/2015    Patient Active Problem List   Diagnosis Date Noted  . Obstructive sleep apnea treated with continuous positive airway pressure (CPAP) 05/28/2018  . Sprain of unspecified ligament of right ankle, initial encounter 12/29/2017  . Obstructive sleep apnea syndrome 07/01/2016  . Hypersomnia with sleep apnea 07/01/2016  . Headache above the eye region 07/01/2016  . Acute seasonal allergic rhinitis due to pollen 07/01/2016  . Abnormal stress test   . Cervical radiculopathy at C5   . Precordial chest pain 11/05/2015  . Depression 11/05/2015  . Chest pain 11/05/2015    Past Surgical History:  Procedure Laterality Date  . CARDIAC CATHETERIZATION N/A 12/24/2015   Procedure: Left Heart Cath and Coronary Angiography;  Surgeon: Jake Bathe, MD;  Location: [facility] INVASIVE CV LAB;  Service: Cardiovascular;  Laterality: N/A;  . VASECTOMY           Home Medications    Prior to Admission medications   Medication Sig Start Date End Date Taking? Authorizing Provider  buPROPion (WELLBUTRIN XL) 300 MG 24 hr tablet Take 300 mg by mouth daily. 12/19/17   [provider]  FLUoxetine (PROZAC) 20 MG capsule Take 20 mg by mouth daily.  10/01/15   [provider]    Family History Family History  Problem Relation Age of Onset  . Hypertension Mother   . Angina Maternal Grandfather     Social History Social History   Tobacco Use  . Smoking status: Never Smoker  . Smokeless tobacco: Never Used  Substance Use Topics  . Alcohol use: Yes    Alcohol/week: 2.0 standard drinks    Types: 2 Cans of beer per week  . Drug use: No     Allergies   Patient has no known allergies.   Review of Systems Review of Systems  Constitutional: Negative for fever.  HENT: Positive for sneezing.   Eyes: Positive for pain and redness. Negative for visual disturbance.  Respiratory: Positive for cough.   Skin: Negative for rash and wound.  Allergic/Immunologic: Negative for immunocompromised state.  Neurological: Negative for headaches.  Hematological: Negative for adenopathy. Does not bruise/bleed easily.  All other systems reviewed and are negative.    Physical Exam Updated Vital Signs BP 132/89 (BP Location: Left Arm)   Pulse 81   Temp  98.1 F (36.7 C) (Oral)   Resp 18   SpO2 97%   Physical Exam Vitals signs and nursing note reviewed.  Constitutional:      General: He is not in acute distress.    Appearance: He is well-developed. He is not diaphoretic.  HENT:     Head: Normocephalic and atraumatic.  Eyes:     General: Lids are normal.        Right eye: No discharge.        Left eye: No discharge.     Extraocular Movements: Extraocular movements intact.     Conjunctiva/sclera:     Right eye: No hemorrhage.    Left eye: Left conjunctiva is not injected. Hemorrhage present. No chemosis or exudate.    Pupils: Pupils  are equal, round, and reactive to light. Pupils are equal.   Neck:     Musculoskeletal: Neck supple.  Pulmonary:     Effort: Pulmonary effort is normal.  Lymphadenopathy:     Cervical: No cervical adenopathy.  Skin:    General: Skin is warm and dry.     Findings: No erythema or rash.  Neurological:     Mental Status: He is alert and oriented to person, place, and time.  Psychiatric:        Behavior: Behavior normal.      ED Treatments / Results  Labs (all labs ordered are listed, but only abnormal results are displayed) Labs Reviewed  CBC WITH DIFFERENTIAL/PLATELET    EKG None  Radiology No results found.  Procedures Procedures (including critical care time)  Medications Ordered in ED Medications - No data to display   Initial Impression / Assessment and Plan / ED Course  I have reviewed the triage vital signs and the nursing notes.  Pertinent labs & imaging results that were available during my care of the patient were reviewed by me and considered in my medical decision making (see chart for details).  Clinical Course as of Jul 21 1442  Fri Jul 22, 4583  889 53 year old male presents with complaint of left eye subconjunctival hemorrhage without trauma or identified injury, not on anticoagulants, no changes in vision.  On exam patient has a subconjunctival hemorrhage to the nasal aspect of his left eye, exam is otherwise unremarkable.  CBC with normal platelet count, within normal limits otherwise.  Offered reassurance, referral to ophthalmology for any concerning symptoms.   [LM]    Clinical Course User Index [LM] Tacy Learn, PA-C      Final Clinical Impressions(s) / ED Diagnoses   Final diagnoses:  None    ED Discharge Orders    None       Tacy Learn, PA-C 07/22/19 1443    Maudie Flakes, MD 07/23/19 [phone]

## 2020-06-04 ENCOUNTER — Encounter: Payer: Self-pay | Admitting: Neurology

## 2020-06-06 ENCOUNTER — Ambulatory Visit (INDEPENDENT_AMBULATORY_CARE_PROVIDER_SITE_OTHER): Admitting: Neurology

## 2020-06-06 ENCOUNTER — Encounter: Payer: Self-pay | Admitting: Neurology

## 2020-06-06 VITALS — BP 124/80 | HR 71 | Ht 70.0 in | Wt 245.0 lb

## 2020-06-06 DIAGNOSIS — Z9989 Dependence on other enabling machines and devices: Secondary | ICD-10-CM

## 2020-06-06 DIAGNOSIS — Z8616 Personal history of COVID-19: Secondary | ICD-10-CM

## 2020-06-06 DIAGNOSIS — G4733 Obstructive sleep apnea (adult) (pediatric): Secondary | ICD-10-CM | POA: Diagnosis not present

## 2020-06-06 NOTE — Progress Notes (Signed)
[facility]  SLEEP MEDICINE CLINIC  PATIENT: Gabriel Vasquez DOB: 04-Jul-1966   REASON FOR VISIT: Follow-up for obstructive sleep apnea with initial CPAP HISTORY FROM: Patient  pt alone, rm 10. presents for yearly follow up visit. states that he is starting to snore through the mask and that has become bothersome. unsure if mask change is needed. DME [facility]   This is a revisit from 06-06-2020 for an established sleep apnea patient, Mr. Gabriel Vasquez.  He is a 54 year old Caucasian right-handed male who has been using CPAP for over 4 years but has recently begun snoring through the mask and machine and we need to get her to see if he needs a new mask new interface or new settings.  He is followed by [facility].  I have viewed the compliance data for the last 90 days and there has been a low residual AHI of 1.8 so there is still a good control of apnea the 95th percentile air leak is 8.9 L/min this is also a well fitting mask.  His CPAP is an AutoSet still set to 1 pressure 11 cmH2O and days used he uses it for 4 hours 18 minutes.  Compliance has been reduced to 68% due to the recent discomfort and snoring breakthroughs at the same time his sleepiness and daytime has increased and his Epworth sleepiness score was not endorsed at 14 out of 24 points which is an elevated level.  I think we do not have to control air leaks here what he have to do is to find a more comfortable mask maybe even the mask that the patient can alternate when pressure marks or discomfort develop from the settings and control of apnea I would not need to make any changes and his machine is still 1 year away from being replaced. He has been happy with his nasal pillow  Dream wear and may have oral leaks- I will prescribe a chin strap.    RV 06-02-2019, I have the pleasure of meeting today with Mr. Gabriel Vasquez, I mean by 54 year old right-handed Caucasian gentleman who had been tested  for obstructive sleep apnea and a sleep study performed on November 2017.  He had moderate sleep apnea at the time of 20.9 AHI per hour but strong REM sleep exacerbation he continues to use CPAP. His compliance has slept and sometimes he falls asleep before he has been ready to put it on.  He also travels all back to school but precautions.  Compliance has been 53% for the.  Until 05/24/2019, machine is set at 11 cmH2O with 1 cm EPR, his AHI is 2.1 which still speaks for an excellent resolution of the 95th percentile pressure is 9.8 cmH2O which also means that his current pressure setting covers his meals.  There are no central apneas emerging. Epworth Sleepiness Score 8/ 24 points.    HISTORY OF PRESENT ILLNESS:UPDATE 05/28/2018: CM Gabriel Vasquez, 54 year old male returns for follow-up for initial CPAP.  He claims he is getting used to it and has had no problems.  Apparently he was on CPAP  years ago and it did not use his CPAP for a period of years.  He was originally diagnosed when he was 55 years old.  Compliance data dated 04/27/2018-05/26/2018 shows compliance greater than 4 hours at 93%.  Average usage 5 hours 19 minutes set pressure 11 cm.  AHI 1.3.  No obstructive or central apneas.  He returns for reevaluation  02/09/18 :CD  I had the pleasure of meeting today with Mr. Gabriel Vasquez on 09 Feb 2018 this is a revisit after his November 2017 sleep study.  A baseline polysomnography was performed and revealed a moderate sleep apnea at 20.9 events per hour, during REM sleep exacerbated to 58.5/h, I suggested that he should return for a CPAP titration study.  This was followed by a CPAP titration study on 24 September 2016, during which the patient was titrated to a pressure of 11 cmH2O with a reduction in AHI to 1.3.  He states that in order for CPAP was never issued.  The settings seem to have been optimal as his minimum oxygenation rose to 94% and his sleep efficiency was around 80% he slept about 94 minutes at the  last name to pressure.      Kinser Fellman is a 54 y.o. male , seen here as a referral from [facility] [place],  Gabriel Vasquez reports that he was evaluated for sleep apnea and diagnosed with sleep apnea around age 20, and his CPAP was prescribed to him. Initially he was a compliant user and over the years he felt that the CPAP has become more of a burden. The machine is no longer found, he has moved and he is not even sure where he stores it at this time. However it would have not been used for over 5 years. Gabriel Vasquez was tested and treated in [place]. He also reported sinus and rhinitis problems but these have not culminated to epistaxis. Sometimes he has a moderate degree headache in the frontal sinus region, sometimes a sore throat, but it is  his wife, who is  concerned about his very loud, thunderous snoring, which keeps her from sleeping and arrange for this visit.   Sleep habits are as follows: Mr. Gabriel Vasquez share a bedroom but it has been hard for [name] to find sleep. He usually can go to sleep at any time and is rather promptly asleep and he reports sleeping so deep that it knocked on the door or a light nudge will not wake him. He will go to bed between 9:30 and 10:00, will be finding himself asleep promptly. He will start of on his side but usually finds himself at some time during the night on his back. He will sleep on one pillow. The bedroom is cool and quiet and dark. He does not have nocturia and can usually sleeps through the night until the morning hours. He will arise by 5:30 or 6 AM, used to be spontaneously arousing but now needs an alarm. He will even hits the snooze button  multiple times- craving a little extra sleep. He feels neither fully refreshed nor restored in the morning. He does not feel that he is necessarily suffering from a dry mouth but he recently had more sinus headaches ( these are seasonal, about 8 month per year). He wakes up with headaches, and  finds himself a mouth breather.     REVIEW OF SYSTEMS: Full 14 system review of systems performed and notable only for those listed, all others are Sleep : Obstructive sleep apnea with CPAP. No loss of smell or tastte.  No changes in sleep wake cycle.   son was infected, asymptomic initially- girlfriend's room mate was infected.  he isolated outside the parental home and recovered.    ALLERGIES: No Known Allergies  HOME MEDICATIONS: Outpatient Medications Prior to Visit  Medication Sig Dispense Refill  . buPROPion (WELLBUTRIN XL) 300 MG 24  hr tablet Take 300 mg by mouth daily.  1  . FLUoxetine (PROZAC) 20 MG capsule Take 20 mg by mouth daily.   1   No facility-administered medications prior to visit.    PAST MEDICAL HISTORY: Past Medical History:  Diagnosis Date  . Cervical radiculopathy at C5   . Chest pain 11/05/2015  . Depression   . Hemorrhoids   . Mixed anxiety and depressive disorder   . OSA on CPAP 2019  . Precordial chest pain 11/05/2015    PAST SURGICAL HISTORY: Past Surgical History:  Procedure Laterality Date  . CARDIAC CATHETERIZATION N/A 12/24/2015   Procedure: Left Heart Cath and Coronary Angiography;  Surgeon: Jake BatheMark C Skains, MD;  [facility];  Service: Cardiovascular;  Laterality: N/A;  . VASECTOMY      FAMILY HISTORY: Family History  Problem Relation Age of Onset  . Hypertension Mother   . Angina Maternal Grandfather     SOCIAL HISTORY: Social History   Socioeconomic History  . Marital status: Married    Spouse name: Not on file  . Number of children: Not on file  . Years of education: Not on file  . Highest education level: Not on file  Occupational History  . Not on file  Social Needs  . Financial resource strain: Not on file  . Food insecurity    Worry: Not on file    Inability: Not on file  . Transportation needs    Medical: Not on file    Non-medical: Not on file  Tobacco Use  . Smoking status: Never Smoker  .  Smokeless tobacco: Never Used  Substance and Sexual Activity  . Alcohol use: Yes    Alcohol/week: 2.0 standard drinks    Types: 2 Cans of beer per week  . Drug use: No  . Sexual activity: Yes  Lifestyle  . Physical activity    Days per week: Not on file    Minutes per session: Not on file  . Stress: Not on file  Relationships  . Social Musicianconnections    Talks on phone: Not on file    Gets together: Not on file    Attends religious service: Not on file    Active member of club or organization: Not on file    Attends meetings of clubs or organizations: Not on file    Relationship status: Not on file  . Intimate partner violence    Fear of current or ex partner: Not on file    Emotionally abused: Not on file    Physically abused: Not on file    Forced sexual activity: Not on file  Other Topics Concern  . Not on file  Social History Narrative  . Not on file  Not in the gym since covid, no regular exercise.      PHYSICAL EXAM  Vitals:   06/06/20 0912  BP: 124/80  Pulse: 71  Weight: 245 lb (111.1 kg)  Height: 5\' 10"  (1.778 m)   Body mass index is 35.15 kg/m.  Generalized: Well developed, in no acute distress  Head: normocephalic and atraumatic,. Oropharynx benign mallopatti3 Neck: Supple, circumference 18 inches  Lungs clear Musculoskeletal: No deformity  Skin no rash or edema Neurological examination   Mentation: Alert oriented to time, place, history taking. Attention span and concentration appropriate. Recent and remote memory intact.  Follows all commands speech and language fluent.   Cranial nerve II-XII: Pupils were equal round reactive to light extraocular movements were full, visual  field were full on confrontational test. Facial sensation and strength were normal. hearing was intact to finger rubbing bilaterally. Uvula tongue midline. head turning and shoulder shrug were normal and symmetric.Tongue protrusion into cheek strength was normal. Motor: normal bulk  and tone, full strength in the BUE, BLE,  Sensory: normal and symmetric to light touch,   Coordination: finger-nose- no dysmetria Gait and Station: Rising up from seated position without assistance, normal stance,  moderate stride, good arm swing, smooth turning, able to perform tiptoe, and heel walking without difficulty. Tandem gait is steady  DIAGNOSTIC DATA (LABS, IMAGING, TESTING) - I reviewed patient records, labs, notes, testing and imaging myself where available.   Labs through [place].  CPAP downloads for 90 and 30 days.   Not vaccinated for covid - ! Had Covid in November 2020- ageusia and anosmia- not fully recovered.   ASSESSMENT AND PLAN  54 y.o. year old male  has a past medical history of obstructive sleep apnea here for slipping compliance on CPAP , nasal pillow user. P10.  He is happy with nasal pillow, may switch to bella swift. [facility].  Will add a chin strap.   Melvyn Novas, MD    [facility] [address], [address] [place], [place] [address] [phone]

## 2020-06-07 ENCOUNTER — Encounter: Payer: Self-pay | Admitting: Neurology

## 2020-06-07 LAB — SAR COV2 SEROLOGY (COVID19)AB(IGG),IA: DiaSorin SARS-CoV-2 Ab, IgG: POSITIVE

## 2020-06-07 NOTE — Progress Notes (Signed)
IgG Covid 19 Antibodies are still present as of 05-2020. History of infection in 2020.

## 2021-05-05 NOTE — Telephone Encounter (Signed)
Phone note was opened by Irene Pap ,RPSGT

## 2021-06-05 NOTE — Progress Notes (Signed)
PATIENT: Gabriel Vasquez DOB: Jun 05, 1966  REASON FOR VISIT: follow up HISTORY FROM: patient  Chief Complaint  Patient presents with   Follow-up    Rm 6 alone here for yearly f/u. Pt reports he has been doing well, no concerns.     HISTORY OF PRESENT ILLNESS:  06/06/21 ALL:  Gabriel Vasquez is a 55 y.o. male here today for follow up for OSA on CPAP. He reports doing well on therapy. He admits that compliance has been lower than normal due to traveling this summer. He does note benefit of using CPAP. He denies difficulty with machine or supplies.      HISTORY: (copied from Dr Dohmeier's previous note)  This is a revisit from 06-06-2020 for an established sleep apnea patient, Gabriel Vasquez.  He is a 55 year old Caucasian right-handed male who has been using CPAP for over 4 years but has recently begun snoring through the mask and machine and we need to get her to see if he needs a new mask new interface or new settings.  He is [facility].  I have viewed the compliance data for the last 90 days and there has been a low residual AHI of 1.8 so there is still a good control of apnea the 95th percentile air leak is 8.9 L/min this is also a well fitting mask.  His CPAP is an AutoSet still set to 1 pressure 11 cmH2O and days used he uses it for 4 hours 18 minutes.  Compliance has been reduced to 68% due to the recent discomfort and snoring breakthroughs at the same time his sleepiness and daytime has increased and his Epworth sleepiness score was not endorsed at 14 out of 24 points which is an elevated level.  I think we do not have to control air leaks here what he have to do is to find a more comfortable mask maybe even the mask that the patient can alternate when pressure marks or discomfort develop from the settings and control of apnea I would not need to make any changes and his machine is still 1 year away from being replaced. He has been happy with his nasal pillow  Dream  wear and may have oral leaks- I will prescribe a chin strap.    REVIEW OF SYSTEMS: Out of a complete 14 system review of symptoms, the patient complains only of the following symptoms, none and all other reviewed systems are negative.   ALLERGIES: No Known Allergies  HOME MEDICATIONS: Outpatient Medications Prior to Visit  Medication Sig Dispense Refill   buPROPion (WELLBUTRIN XL) 300 MG 24 hr tablet Take 300 mg by mouth daily.  1   FLUoxetine (PROZAC) 20 MG capsule Take 20 mg by mouth daily.  1   No facility-administered medications prior to visit.    PAST MEDICAL HISTORY: Past Medical History:  Diagnosis Date   Cervical radiculopathy at C5    Chest pain 11/05/2015   Depression    Hemorrhoids    Mixed anxiety and depressive disorder    OSA on CPAP 2019   Precordial chest pain 11/05/2015    PAST SURGICAL HISTORY: Past Surgical History:  Procedure Laterality Date   CARDIAC CATHETERIZATION N/A 12/24/2015   Procedure: Left Heart Cath and Coronary Angiography;  Surgeon: Jake Bathe, MD;  Location: [facility] INVASIVE CV LAB;  Service: Cardiovascular;  Laterality: N/A;   VASECTOMY      FAMILY HISTORY: Family History  Problem Relation Age of Onset  Hypertension Mother    Angina Maternal Grandfather     SOCIAL HISTORY: Social History   Socioeconomic History   Marital status: Married    Spouse name: Not on file   Number of children: Not on file   Years of education: Not on file   Highest education level: Not on file  Occupational History   Not on file  Tobacco Use   Smoking status: Never   Smokeless tobacco: Never  Substance and Sexual Activity   Alcohol use: Yes    Alcohol/week: 2.0 standard drinks    Types: 2 Cans of beer per week   Drug use: No   Sexual activity: Yes  Other Topics Concern   Not on file  Social History Narrative   Not on file   Social Determinants of Health   Financial Resource Strain: Not on file  Food Insecurity: Not on file  Transportation  Needs: Not on file  Physical Activity: Not on file  Stress: Not on file  Social Connections: Not on file  Intimate Partner Violence: Not on file     PHYSICAL EXAM  Vitals:   06/06/21 0919  BP: 122/80  Pulse: 71  SpO2: 98%  Weight: 250 lb (113.4 kg)  Height: 5\' 10"  (1.778 m)   Body mass index is 35.87 kg/m.  Generalized: Well developed, in no acute distress  Cardiology: normal rate and rhythm, no murmur noted Respiratory: clear to auscultation bilaterally  Neurological examination  Mentation: Alert oriented to time, place, history taking. Follows all commands speech and language fluent Cranial nerve II-XII: Pupils were equal round reactive to light. Extraocular movements were full, visual field were full  Motor: The motor testing reveals 5 over 5 strength of all 4 extremities. Good symmetric motor tone is noted throughout.  Gait and station: Gait is normal.    DIAGNOSTIC DATA (LABS, IMAGING, TESTING) - I reviewed patient records, labs, notes, testing and imaging myself where available.  No flowsheet data found.   Lab Results  Component Value Date   WBC 7.4 07/22/2019   HGB 16.0 07/22/2019   HCT 45.2 07/22/2019   MCV 86.9 07/22/2019   PLT 214 07/22/2019      Component Value Date/Time   NA 137 12/20/2015 1016   K 4.5 12/20/2015 1016   CL 104 12/20/2015 1016   CO2 27 12/20/2015 1016   GLUCOSE 91 12/20/2015 1016   BUN 19 12/20/2015 1016   CREATININE 1.05 12/20/2015 1016   CALCIUM 9.0 12/20/2015 1016   PROT 6.8 11/06/2015 0555   ALBUMIN 4.0 11/06/2015 0555   AST 17 11/06/2015 0555   ALT 27 11/06/2015 0555   ALKPHOS 63 11/06/2015 0555   BILITOT 0.6 11/06/2015 0555   GFRNONAA >60 11/06/2015 0555   GFRAA >60 11/06/2015 0555   Lab Results  Component Value Date   CHOL 194 12/05/2015   HDL 41 12/05/2015   LDLCALC 135 (H) 12/05/2015   TRIG 91 12/05/2015   CHOLHDL 4.7 12/05/2015   No results found for: HGBA1C No results found for: VITAMINB12 No results found  for: TSH   ASSESSMENT AND PLAN 55 y.o. year old male  has a past medical history of Cervical radiculopathy at C5, Chest pain (11/05/2015), Depression, Hemorrhoids, Mixed anxiety and depressive disorder, OSA on CPAP (2019), and Precordial chest pain (11/05/2015). here with     ICD-10-CM   1. OSA on CPAP  G47.33 For home use only DME continuous positive airway pressure (CPAP)   Z99.89  Jacinto Keil is doing well on CPAP therapy. Compliance report reveals acceptable compliance. He was encouraged to continue using CPAP nightly and for greater than 4 hours each night. We will update supply orders as indicated. Risks of untreated sleep apnea review and education materials provided. Healthy lifestyle habits encouraged. He will follow up in 1 year, sooner if needed. He verbalizes understanding and agreement with this plan.   Current CPAP set up date 02/17/2018  Orders Placed This Encounter  Procedures   For home use only DME continuous positive airway pressure (CPAP)    Supplies    Order Specific Question:   Length of Need    Answer:   Lifetime    Order Specific Question:   Patient has OSA or probable OSA    Answer:   Yes    Order Specific Question:   Is the patient currently using CPAP in the home    Answer:   Yes    Order Specific Question:   Settings    Answer:   Other see comments    Order Specific Question:   CPAP supplies needed    Answer:   Mask, headgear, cushions, filters, heated tubing and water chamber      No orders of the defined types were placed in this encounter.     Shawnie Dapper, FNP-C 06/06/2021, 10:15 AM [facility] [address], [address] [place], [place] [address] [phone]

## 2021-06-05 NOTE — Patient Instructions (Signed)

## 2021-06-06 ENCOUNTER — Encounter: Payer: Self-pay | Admitting: Family Medicine

## 2021-06-06 ENCOUNTER — Ambulatory Visit (INDEPENDENT_AMBULATORY_CARE_PROVIDER_SITE_OTHER): Admitting: Family Medicine

## 2021-06-06 VITALS — BP 122/80 | HR 71 | Ht 70.0 in | Wt 250.0 lb

## 2021-06-06 DIAGNOSIS — Z9989 Dependence on other enabling machines and devices: Secondary | ICD-10-CM | POA: Diagnosis not present

## 2021-06-06 DIAGNOSIS — G4733 Obstructive sleep apnea (adult) (pediatric): Secondary | ICD-10-CM | POA: Diagnosis not present

## 2022-06-05 NOTE — Progress Notes (Unsigned)
PATIENT: Gabriel Vasquez DOB: [birth year]  REASON FOR VISIT: follow up HISTORY FROM: patient  No chief complaint on file.    HISTORY OF PRESENT ILLNESS:  06/05/22 ALL:  Conley returns for follow up for OSA on CPAP.   06/06/2021 ALL: Gabriel Vasquez is a 56 y.o. male here today for follow up for OSA on CPAP. He reports doing well on therapy. He admits that compliance has been lower than normal due to traveling this summer. He does note benefit of using CPAP. He denies difficulty with machine or supplies.      HISTORY: (copied from Dr Dohmeier's previous note)  This is a revisit from 06-06-2020 for an established sleep apnea patient, Mr. Gabriel Vasquez.  He is a 56 year old Caucasian right-handed male who has been using CPAP for over 4 years but has recently begun snoring through the mask and machine and we need to get her to see if he needs a new mask new interface or new settings.  He is followed by [facility].  I have viewed the compliance data for the last 90 days and there has been a low residual AHI of 1.8 so there is still a good control of apnea the 95th percentile air leak is 8.9 L/min this is also a well fitting mask.  His CPAP is an AutoSet still set to 1 pressure 11 cmH2O and days used he uses it for 4 hours 18 minutes.  Compliance has been reduced to 68% due to the recent discomfort and snoring breakthroughs at the same time his sleepiness and daytime has increased and his Epworth sleepiness score was not endorsed at 14 out of 24 points which is an elevated level.  I think we do not have to control air leaks here what he have to do is to find a more comfortable mask maybe even the mask that the patient can alternate when pressure marks or discomfort develop from the settings and control of apnea I would not need to make any changes and his machine is still 1 year away from being replaced. He has been happy with his nasal pillow  Dream wear and may have oral leaks- I will prescribe  a chin strap.    REVIEW OF SYSTEMS: Out of a complete 14 system review of symptoms, the patient complains only of the following symptoms, none and all other reviewed systems are negative.   ALLERGIES: No Known Allergies  HOME MEDICATIONS: Outpatient Medications Prior to Visit  Medication Sig Dispense Refill   buPROPion (WELLBUTRIN XL) 300 MG 24 hr tablet Take 300 mg by mouth daily.  1   FLUoxetine (PROZAC) 20 MG capsule Take 20 mg by mouth daily.  1   No facility-administered medications prior to visit.    PAST MEDICAL HISTORY: Past Medical History:  Diagnosis Date   Cervical radiculopathy at C5    Chest pain 11/05/2015   Depression    Hemorrhoids    Mixed anxiety and depressive disorder    OSA on CPAP 2019   Precordial chest pain 11/05/2015    PAST SURGICAL HISTORY: Past Surgical History:  Procedure Laterality Date   CARDIAC CATHETERIZATION N/A 12/24/2015   Procedure: Left Heart Cath and Coronary Angiography;  Surgeon: Jake Bathe, MD;  Location: [facility];  Service: Cardiovascular;  Laterality: N/A;   VASECTOMY      FAMILY HISTORY: Family History  Problem Relation Age of Onset   Hypertension Mother    Angina Maternal Grandfather  SOCIAL HISTORY: Social History   Socioeconomic History   Marital status: Married    Spouse name: Not on file   Number of children: Not on file   Years of education: Not on file   Highest education level: Not on file  Occupational History   Not on file  Tobacco Use   Smoking status: Never   Smokeless tobacco: Never  Substance and Sexual Activity   Alcohol use: Yes    Alcohol/week: 2.0 standard drinks of alcohol    Types: 2 Cans of beer per week   Drug use: No   Sexual activity: Yes  Other Topics Concern   Not on file  Social History Narrative   Not on file   Social Determinants of Health   Financial Resource Strain: Not on file  Food Insecurity: Not on file  Transportation Needs: Not on file  Physical  Activity: Not on file  Stress: Not on file  Social Connections: Not on file  Intimate Partner Violence: Not on file     PHYSICAL EXAM  There were no vitals filed for this visit.  There is no height or weight on file to calculate BMI.  Generalized: Well developed, in no acute distress  Cardiology: normal rate and rhythm, no murmur noted Respiratory: clear to auscultation bilaterally  Neurological examination  Mentation: Alert oriented to time, place, history taking. Follows all commands speech and language fluent Cranial nerve II-XII: Pupils were equal round reactive to light. Extraocular movements were full, visual field were full  Motor: The motor testing reveals 5 over 5 strength of all 4 extremities. Good symmetric motor tone is noted throughout.  Gait and station: Gait is normal.    DIAGNOSTIC DATA (LABS, IMAGING, TESTING) - I reviewed patient records, labs, notes, testing and imaging myself where available.      No data to display           Lab Results  Component Value Date   WBC 7.4 07/22/2019   HGB 16.0 07/22/2019   HCT 45.2 07/22/2019   MCV 86.9 07/22/2019   PLT 214 07/22/2019      Component Value Date/Time   NA 137 12/20/2015 1016   K 4.5 12/20/2015 1016   CL 104 12/20/2015 1016   CO2 27 12/20/2015 1016   GLUCOSE 91 12/20/2015 1016   BUN 19 12/20/2015 1016   CREATININE 1.05 12/20/2015 1016   CALCIUM 9.0 12/20/2015 1016   PROT 6.8 11/06/2015 0555   ALBUMIN 4.0 11/06/2015 0555   AST 17 11/06/2015 0555   ALT 27 11/06/2015 0555   ALKPHOS 63 11/06/2015 0555   BILITOT 0.6 11/06/2015 0555   GFRNONAA >60 11/06/2015 0555   GFRAA >60 11/06/2015 0555   Lab Results  Component Value Date   CHOL 194 12/05/2015   HDL 41 12/05/2015   LDLCALC 135 (H) 12/05/2015   TRIG 91 12/05/2015   CHOLHDL 4.7 12/05/2015   No results found for: "HGBA1C" No results found for: "VITAMINB12" No results found for: "TSH"   ASSESSMENT AND PLAN 56 y.o. year old male  has a  past medical history of Cervical radiculopathy at C5, Chest pain (11/05/2015), Depression, Hemorrhoids, Mixed anxiety and depressive disorder, OSA on CPAP (2019), and Precordial chest pain (11/05/2015). here with   No diagnosis found.     [name] is doing well on CPAP therapy. Compliance report reveals acceptable compliance. He was encouraged to continue using CPAP nightly and for greater than 4 hours each night. We will update supply orders  as indicated. Risks of untreated sleep apnea review and education materials provided. Healthy lifestyle habits encouraged. He will follow up in 1 year, sooner if needed. He verbalizes understanding and agreement with this plan.   Current CPAP set up date 02/17/2018  No orders of the defined types were placed in this encounter.     No orders of the defined types were placed in this encounter.      Shawnie Dapper, FNP-C 06/05/2022, 9:08 AM [facility] [address], [address] [place], [place] [address] [phone]

## 2022-06-05 NOTE — Patient Instructions (Incomplete)
Please continue using your CPAP regularly. While your insurance requires that you use CPAP at least 4 hours each night on 70% of the nights, I recommend, that you not skip any nights and use it throughout the night if you can. Getting used to CPAP and staying with the treatment long term does take time and patience and discipline. Untreated obstructive sleep apnea when it is moderate to severe can have an adverse impact on cardiovascular health and raise her risk for heart disease, arrhythmias, hypertension, congestive heart failure, stroke and diabetes. Untreated obstructive sleep apnea causes sleep disruption, nonrestorative sleep, and sleep deprivation. This can have an impact on your day to day functioning and cause daytime sleepiness and impairment of cognitive function, memory loss, mood disturbance, and problems focussing. Using CPAP regularly can improve these symptoms.   Follow up in 6 months   DME: DME: [facility] Phone: [phone], press option 1

## 2022-06-09 ENCOUNTER — Ambulatory Visit (INDEPENDENT_AMBULATORY_CARE_PROVIDER_SITE_OTHER): Admitting: Family Medicine

## 2022-06-09 ENCOUNTER — Encounter: Payer: Self-pay | Admitting: Family Medicine

## 2022-06-09 VITALS — BP 131/88 | HR 64 | Ht 70.0 in | Wt 238.0 lb

## 2022-06-09 DIAGNOSIS — G4733 Obstructive sleep apnea (adult) (pediatric): Secondary | ICD-10-CM | POA: Diagnosis not present

## 2022-06-09 DIAGNOSIS — Z9989 Dependence on other enabling machines and devices: Secondary | ICD-10-CM

## 2022-06-10 ENCOUNTER — Telehealth: Payer: Self-pay | Admitting: Neurology

## 2022-12-04 NOTE — Patient Instructions (Addendum)
Please continue using your CPAP regularly. While your insurance requires that you use CPAP at least 4 hours each night on 70% of the nights, I recommend, that you not skip any nights and use it throughout the night if you can. Getting used to CPAP and staying with the treatment long term does take time and patience and discipline. Untreated obstructive sleep apnea when it is moderate to severe can have an adverse impact on cardiovascular health and raise her risk for heart disease, arrhythmias, hypertension, congestive heart failure, stroke and diabetes. Untreated obstructive sleep apnea causes sleep disruption, nonrestorative sleep, and sleep deprivation. This can have an impact on your day to day functioning and cause daytime sleepiness and impairment of cognitive function, memory loss, mood disturbance, and problems focussing. Using CPAP regularly can improve these symptoms.   We will repeat a home sleep study to evaluate need to continue CPAP then plan to place orders for new machine pending results. You will need to follow up within 31-90 days following set up of new machine. Let me know if you need orders for a travel machine. See you soon!

## 2022-12-04 NOTE — Progress Notes (Signed)
PATIENT: Gabriel Vasquez DOB: [birth year]  REASON FOR VISIT: follow up HISTORY FROM: patient  Chief Complaint  Patient presents with   Follow-up    Pt in room 1, here for Cpap follow up. Pt reports doing well, he would like to discuss a travel cpap machine.      HISTORY OF PRESENT ILLNESS:  12/08/22 ALL:  Gabriel Vasquez returns for follow up for OSA on CPAP. He continues to do well on therapy. He is using his machine most every night for about 5.5 hours. He does note improved sleep quality and more daytime energy with using CPAP. He denies concerns with his machine or supplies. He is eligible for a new machine 01/2023. He travels often and would like to have travel machine.     06/09/2022 ALL: Gabriel Vasquez returns for follow up for OSA on CPAP. He admits that compliance has been lower over the past year due to travel for work. He also forgets to start therapy at night when home. He has had some difficulty with his nasal mask shifting around at night. He was tried multiple sizes with no improvement. Otherwise, he is doing well. He does note improvement in sleep quality with CPAP use.   Set up 01/2018.    06/06/2021 ALL: Gabriel [employer] is a 57 y.o. male here today for follow up for OSA on CPAP. He reports doing well on therapy. He admits that compliance has been lower than normal due to traveling this summer. He does note benefit of using CPAP. He denies difficulty with machine or supplies.      HISTORY: (copied from Dr Dohmeier's previous note)  This is a revisit from 06-06-2020 for an established sleep apnea patient, Gabriel Vasquez.  He is a 57 year old Caucasian right-handed male who has been using CPAP for over 4 years but has recently begun snoring through the mask and machine and we need to get her to see if he needs a new mask new interface or new settings.  He is followed by [facility].  I have viewed the compliance data for the last 90 days and there has been a low residual AHI of 1.8 so  there is still a good control of apnea the 95th percentile air leak is 8.9 L/min this is also a well fitting mask.  His CPAP is an AutoSet still set to 1 pressure 11 cmH2O and days used he uses it for 4 hours 18 minutes.  Compliance has been reduced to 68% due to the recent discomfort and snoring breakthroughs at the same time his sleepiness and daytime has increased and his Epworth sleepiness score was not endorsed at 14 out of 24 points which is an elevated level.  I think we do not have to control air leaks here what he have to do is to find a more comfortable mask maybe even the mask that the patient can alternate when pressure marks or discomfort develop from the settings and control of apnea I would not need to make any changes and his machine is still 1 year away from being replaced. He has been happy with his nasal pillow  Dream wear and may have oral leaks- I will prescribe a chin strap.    REVIEW OF SYSTEMS: Out of a complete 14 system review of symptoms, the patient complains only of the following symptoms, none and all other reviewed systems are negative.  ESS: 8/24  ALLERGIES: No Known Allergies  HOME MEDICATIONS: Outpatient Medications Prior to Visit  Medication Sig Dispense Refill   buPROPion (WELLBUTRIN XL) 300 MG 24 hr tablet Take 300 mg by mouth daily.  1   FLUoxetine (PROZAC) 20 MG capsule Take 20 mg by mouth daily.  1   No facility-administered medications prior to visit.    PAST MEDICAL HISTORY: Past Medical History:  Diagnosis Date   Cervical radiculopathy at C5    Chest pain 11/05/2015   Depression    Hemorrhoids    Mixed anxiety and depressive disorder    OSA on CPAP 2019   Precordial chest pain 11/05/2015    PAST SURGICAL HISTORY: Past Surgical History:  Procedure Laterality Date   CARDIAC CATHETERIZATION N/A 12/24/2015   Procedure: Left Heart Cath and Coronary Angiography;  Surgeon: Jerline Pain, MD;  Location: [facility];  Service: Cardiovascular;   Laterality: N/A;   VASECTOMY      FAMILY HISTORY: Family History  Problem Relation Age of Onset   Hypertension Mother    Angina Maternal Grandfather     SOCIAL HISTORY: Social History   Socioeconomic History   Marital status: Married    Spouse name: Not on file   Number of children: Not on file   Years of education: Not on file   Highest education level: Not on file  Occupational History   Not on file  Tobacco Use   Smoking status: Never   Smokeless tobacco: Never  Substance and Sexual Activity   Alcohol use: Yes    Alcohol/week: 2.0 standard drinks of alcohol    Types: 2 Cans of beer per week   Drug use: No   Sexual activity: Yes  Other Topics Concern   Not on file  Social History Narrative   Not on file   Social Determinants of Health   Financial Resource Strain: Not on file  Food Insecurity: Not on file  Transportation Needs: Not on file  Physical Activity: Not on file  Stress: Not on file  Social Connections: Not on file  Intimate Partner Violence: Not on file     PHYSICAL EXAM  Vitals:   12/08/22 1002  BP: 129/83  Pulse: 70  Weight: 254 lb 12.8 oz (115.6 kg)  Height: 5\' 10"  (1.778 m)     Body mass index is 36.56 kg/m.  Generalized: Well developed, in no acute distress  Cardiology: normal rate and rhythm, no murmur noted Respiratory: clear to auscultation bilaterally  Neurological examination  Mentation: Alert oriented to time, place, history taking. Follows all commands speech and language fluent Cranial nerve II-XII: Pupils were equal round reactive to light. Extraocular movements were full, visual field were full  Motor: The motor testing reveals 5 over 5 strength of all 4 extremities. Good symmetric motor tone is noted throughout.  Gait and station: Gait is normal.    DIAGNOSTIC DATA (LABS, IMAGING, TESTING) - I reviewed patient records, labs, notes, testing and imaging myself where available.      No data to display            Lab Results  Component Value Date   WBC 7.4 07/22/2019   HGB 16.0 07/22/2019   HCT 45.2 07/22/2019   MCV 86.9 07/22/2019   PLT 214 07/22/2019      Component Value Date/Time   NA 137 12/20/2015 1016   K 4.5 12/20/2015 1016   CL 104 12/20/2015 1016   CO2 27 12/20/2015 1016   GLUCOSE 91 12/20/2015 1016   BUN 19 12/20/2015 1016   CREATININE 1.05 12/20/2015 1016  CALCIUM 9.0 12/20/2015 1016   PROT 6.8 11/06/2015 0555   ALBUMIN 4.0 11/06/2015 0555   AST 17 11/06/2015 0555   ALT 27 11/06/2015 0555   ALKPHOS 63 11/06/2015 0555   BILITOT 0.6 11/06/2015 0555   GFRNONAA >60 11/06/2015 0555   GFRAA >60 11/06/2015 0555   Lab Results  Component Value Date   CHOL 194 12/05/2015   HDL 41 12/05/2015   LDLCALC 135 (H) 12/05/2015   TRIG 91 12/05/2015   CHOLHDL 4.7 12/05/2015   No results found for: "HGBA1C" No results found for: "VITAMINB12" No results found for: "TSH"   ASSESSMENT AND PLAN 57 y.o. year old male  has a past medical history of Cervical radiculopathy at C5, Chest pain (11/05/2015), Depression, Hemorrhoids, Mixed anxiety and depressive disorder, OSA on CPAP (2019), and Precordial chest pain (11/05/2015). here with     ICD-10-CM   1. OSA on CPAP  G47.33 Home sleep test    For home use only DME continuous positive airway pressure (CPAP)       Theoplis Garciagarcia is doing well on CPAP therapy. Compliance report reveals excellent daily and optimal 4 hour compliance. He was encouraged to continue using CPAP nightly and for greater than 4 hours each night. We will update supply orders as indicated. Risks of untreated sleep apnea review and education materials provided. We will repeat HST and order new CPAP pending results. He may reach out for orders to obtain travel machine if he wishes. Healthy lifestyle habits encouraged. He will follow up in 31-90 days following set up of new machine. He verbalizes understanding and agreement with this plan.   Current CPAP set up date  02/17/2018  Orders Placed This Encounter  Procedures   For home use only DME continuous positive airway pressure (CPAP)    Supplies    Order Specific Question:   Length of Need    Answer:   Lifetime    Order Specific Question:   Patient has OSA or probable OSA    Answer:   Yes    Order Specific Question:   Is the patient currently using CPAP in the home    Answer:   Yes    Order Specific Question:   Settings    Answer:   Other see comments    Order Specific Question:   CPAP supplies needed    Answer:   Mask, headgear, cushions, filters, heated tubing and water chamber   Home sleep test    Standing Status:   Future    Standing Expiration Date:   12/04/2023    Order Specific Question:   Where should this test be performed:    Answer:   [place]      No orders of the defined types were placed in this encounter.      Debbora Presto, FNP-C 12/08/2022, 10:41 AM [facility] [address], [place], [place] [address] [phone]

## 2022-12-08 ENCOUNTER — Ambulatory Visit (INDEPENDENT_AMBULATORY_CARE_PROVIDER_SITE_OTHER): Admitting: Family Medicine

## 2022-12-08 ENCOUNTER — Encounter: Payer: Self-pay | Admitting: Family Medicine

## 2022-12-08 VITALS — BP 129/83 | HR 70 | Ht 70.0 in | Wt 254.8 lb

## 2022-12-08 DIAGNOSIS — G4733 Obstructive sleep apnea (adult) (pediatric): Secondary | ICD-10-CM | POA: Diagnosis not present

## 2022-12-24 ENCOUNTER — Telehealth: Payer: Self-pay | Admitting: Family Medicine

## 2022-12-24 NOTE — Telephone Encounter (Signed)
[employer] pending faxed notes 

## 2023-01-13 NOTE — Telephone Encounter (Signed)
4/16:LVM-MLA 12/29/22 [employer] no auth req EE

## 2024-11-03 ENCOUNTER — Encounter: Payer: Self-pay | Admitting: Neurology

## 2024-11-03 ENCOUNTER — Encounter (INDEPENDENT_AMBULATORY_CARE_PROVIDER_SITE_OTHER): Payer: Self-pay

## 2024-11-03 ENCOUNTER — Ambulatory Visit: Admitting: Neurology

## 2024-11-03 NOTE — Progress Notes (Unsigned)
 Gabriel Vasquez
